# Patient Record
Sex: Female | Born: 1955 | Race: White | Hispanic: No | Marital: Married | Smoking: Never smoker
Health system: Southern US, Community
[De-identification: ages and names within clinical notes are randomized; demographics above are authoritative.]

## PROBLEM LIST (undated history)

## (undated) DIAGNOSIS — E039 Hypothyroidism, unspecified: Secondary | ICD-10-CM

## (undated) DIAGNOSIS — J302 Other seasonal allergic rhinitis: Secondary | ICD-10-CM

## (undated) DIAGNOSIS — M1990 Unspecified osteoarthritis, unspecified site: Secondary | ICD-10-CM

## (undated) DIAGNOSIS — I10 Essential (primary) hypertension: Secondary | ICD-10-CM

## (undated) DIAGNOSIS — K219 Gastro-esophageal reflux disease without esophagitis: Secondary | ICD-10-CM

## (undated) DIAGNOSIS — Z923 Personal history of irradiation: Secondary | ICD-10-CM

## (undated) DIAGNOSIS — Z9889 Other specified postprocedural states: Secondary | ICD-10-CM

## (undated) DIAGNOSIS — R112 Nausea with vomiting, unspecified: Secondary | ICD-10-CM

## (undated) DIAGNOSIS — C50919 Malignant neoplasm of unspecified site of unspecified female breast: Secondary | ICD-10-CM

## (undated) HISTORY — PX: BREAST BIOPSY: SHX20

## (undated) HISTORY — PX: BREAST LUMPECTOMY: SHX2

## (undated) HISTORY — PX: BREAST EXCISIONAL BIOPSY: SUR124

## (undated) HISTORY — PX: DILATION AND CURETTAGE OF UTERUS: SHX78

## (undated) HISTORY — PX: APPENDECTOMY: SHX54

## (undated) HISTORY — PX: KNEE ARTHROSCOPY: SUR90

## (undated) HISTORY — PX: BREAST SURGERY: SHX581

---

## 2014-02-19 DIAGNOSIS — Z923 Personal history of irradiation: Secondary | ICD-10-CM

## 2014-02-19 DIAGNOSIS — C50919 Malignant neoplasm of unspecified site of unspecified female breast: Secondary | ICD-10-CM

## 2014-02-19 HISTORY — DX: Personal history of irradiation: Z92.3

## 2014-02-19 HISTORY — DX: Malignant neoplasm of unspecified site of unspecified female breast: C50.919

## 2014-10-05 DIAGNOSIS — D0511 Intraductal carcinoma in situ of right breast: Secondary | ICD-10-CM | POA: Insufficient documentation

## 2014-10-22 ENCOUNTER — Ambulatory Visit: Payer: Self-pay | Admitting: Surgery

## 2014-10-22 NOTE — H&P (Signed)
History of Present Illness Amanda Haas. Amanda Pottinger MD; 10/22/2014 5:07 PM) Patient words: right breast eval.  The patient is a 59 year old female who presents with a breast mass. Referred by Amanda Aly, NP for evaluation of right breast mass  This is a 59 yo female in good health who presents for evaluation of a new right breast mass. 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 She had a recent routine screening mammogram performed at [facility] and this showed a suspicious area in the right breast in the lower central portion. She underwent stereotactic biopsy which showed calcifications associated with atypical papillary lesion. A biopsy clip was placed. She presents now to discuss excision.  No previous breast problems before mammogram. Significant hematoma and bruising after biopsy.  Menarche -21 First pregnancy 25 Breastfeed - no OCP - 6 months FH - negative Other Problems Amanda Haas, CMA; 10/22/2014 10:08 AM) Arthritis Asthma Diabetes Mellitus Gastric Ulcer Gastroesophageal Reflux Disease High blood pressure Hypercholesterolemia Lump In Breast Thyroid Disease  Past Surgical History Amanda Haas, [place]; 10/22/2014 10:08 AM) Appendectomy Breast Biopsy Right. Knee Surgery Left.  Diagnostic Studies History Amanda Haas, CMA; 10/22/2014 10:08 AM) Colonoscopy 1-5 years ago Mammogram within last year Pap Smear 1-5 years ago  Allergies Amanda Haas, CMA; 10/22/2014 10:09 AM) Lodine *ANALGESICS - ANTI-INFLAMMATORY*  Medication History (Amanda Haas, CMA; 10/22/2014 10:10 AM) Atorvastatin Calcium (10MG  Tablet, Oral) Active. Hydrochlorothiazide (25MG  Tablet, Oral) Active. Pantoprazole Sodium (40MG  Tablet DR, Oral) Active. Levothyroxine Sodium (100MCG Tablet, Oral) Active. Medications Reconciled  Social History Amanda Haas, CMA; 10/22/2014 10:08 AM) Caffeine use Coffee, Tea. No alcohol use No drug use Tobacco use Never  smoker.  Family History Amanda Haas, CMA; 10/22/2014 10:08 AM) Arthritis Father, Mother. Colon Cancer Father. Colon Polyps Father. Diabetes Mellitus Father. Heart Disease Father. Hypertension Father, Mother. Malignant Neoplasm Of Pancreas Mother. Thyroid problems Mother.  Pregnancy / Birth History Amanda Haas, [place]; 10/22/2014 10:08 AM) Age at menarche 70 years. Age of menopause 43-50 Gravida 2 Maternal age 65-25 Para 2     Review of Systems (Amanda Haas; 10/22/2014 10:08 AM) General Not Present- Appetite Loss, Chills, Fatigue, Fever, Night Sweats, Weight Gain and Weight Loss. Skin Not Present- Change in Wart/Mole, Dryness, Hives, Jaundice, New Lesions, Non-Healing Wounds, Rash and Ulcer. HEENT Present- Wears glasses/contact lenses. Not Present- Earache, Hearing Loss, Hoarseness, Nose Bleed, Oral Ulcers, Ringing in the Ears, Seasonal Allergies, Sinus Pain, Sore Throat, Visual Disturbances and Yellow Eyes. Respiratory Not Present- Bloody sputum, Chronic Cough, Difficulty Breathing, Snoring and Wheezing. Breast Present- Breast Mass. Not Present- Breast Pain, Nipple Discharge and Skin Changes. Cardiovascular Not Present- Chest Pain, Difficulty Breathing Lying Down, Leg Cramps, Palpitations, Rapid Heart Rate, Shortness of Breath and Swelling of Extremities. Gastrointestinal Not Present- Abdominal Pain, Bloating, Bloody Stool, Change in Bowel Habits, Chronic diarrhea, Constipation, Difficulty Swallowing, Excessive gas, Gets full quickly at meals, Hemorrhoids, Indigestion, Nausea, Rectal Pain and Vomiting. Female Genitourinary Not Present- Frequency, Nocturia, Painful Urination, Pelvic Pain and Urgency. Musculoskeletal Present- Back Pain and Joint Pain. Not Present- Joint Stiffness, Muscle Pain, Muscle Weakness and Swelling of Extremities. Neurological Not Present- Decreased Memory, Fainting, Headaches, Numbness, Seizures, Tingling, Tremor, Trouble walking and  Weakness. Psychiatric Not Present- Anxiety, Bipolar, Change in Sleep Pattern, Depression, Fearful and Frequent crying. Endocrine Not Present- Cold Intolerance, Excessive Hunger, Hair Changes, Heat Intolerance, Hot flashes and New Diabetes. Hematology Not Present- Easy Bruising, Excessive bleeding, Gland problems, HIV and Persistent Infections.  Vitals Amanda Haas CMA; 10/22/2014 10:09  AM) 10/22/2014 10:08 AM Weight: 178 lb Height: 67in Body Surface Area: 1.95 m Body Mass Index: 27.88 kg/m Temp.: 57F(Temporal)  BP: 130/76 (Sitting, Left Arm, Standard)     Physical Exam Amanda Key K. Cadyn Fann MD; 10/22/2014 5:07 PM)  The physical exam findings are as follows: Note:WDWN in NAD HEENT: EOMI, sclera anicteric Neck: No masses, no thyromegaly Breasts: no palpable masses or lymphdenopathy on the left; no right lymphadenopathy; small resolving hematoma on the right inferior breast Lungs: CTA bilaterally; normal respiratory effort CV: Regular rate and rhythm; no murmurs Abd: +bowel sounds, soft, non-tender, no masses Ext: Well-perfused; no edema Skin: Warm, dry; no sign of jaundice    Assessment & Plan Amanda Key K. Kaven Cumbie MD; 10/22/2014 10:43 AM)  BREAST MASS, RIGHT (611.72  N63)  Current Plans Pt Education - Breast Cancer in Women *: breast lumps Schedule for Surgery - Right radioactive seed-localized lumpectomy. The surgical procedure has been discussed with the patient. Potential risks, benefits, alternative treatments, and expected outcomes have been explained. All of the patient's questions at this time have been answered. The likelihood of reaching the patient's treatment goal is good. The patient understand the proposed surgical procedure and wishes to proceed.  Amanda Haas. Amanda Dover, MD, [facility]  General/ Trauma Surgery  10/22/2014 5:09 PM

## 2014-11-15 ENCOUNTER — Encounter (HOSPITAL_BASED_OUTPATIENT_CLINIC_OR_DEPARTMENT_OTHER): Payer: Self-pay | Admitting: *Deleted

## 2014-11-17 ENCOUNTER — Ambulatory Visit (HOSPITAL_BASED_OUTPATIENT_CLINIC_OR_DEPARTMENT_OTHER): Payer: BLUE CROSS/BLUE SHIELD | Admitting: Anesthesiology

## 2014-11-17 ENCOUNTER — Ambulatory Visit (HOSPITAL_BASED_OUTPATIENT_CLINIC_OR_DEPARTMENT_OTHER)
Admission: RE | Admit: 2014-11-17 | Discharge: 2014-11-17 | Disposition: A | Discharge status: Home / Self Care | Payer: BLUE CROSS/BLUE SHIELD | Source: Ambulatory Visit | Attending: Surgery | Admitting: Surgery

## 2014-11-17 ENCOUNTER — Encounter (HOSPITAL_BASED_OUTPATIENT_CLINIC_OR_DEPARTMENT_OTHER)
Admission: RE | Disposition: A | Discharge status: Home / Self Care | Payer: Self-pay | Source: Ambulatory Visit | Attending: Surgery

## 2014-11-17 ENCOUNTER — Encounter (HOSPITAL_BASED_OUTPATIENT_CLINIC_OR_DEPARTMENT_OTHER): Payer: Self-pay | Admitting: Anesthesiology

## 2014-11-17 DIAGNOSIS — E039 Hypothyroidism, unspecified: Secondary | ICD-10-CM | POA: Insufficient documentation

## 2014-11-17 DIAGNOSIS — D0511 Intraductal carcinoma in situ of right breast: Secondary | ICD-10-CM | POA: Diagnosis not present

## 2014-11-17 DIAGNOSIS — I10 Essential (primary) hypertension: Secondary | ICD-10-CM | POA: Insufficient documentation

## 2014-11-17 DIAGNOSIS — E119 Type 2 diabetes mellitus without complications: Secondary | ICD-10-CM | POA: Diagnosis not present

## 2014-11-17 DIAGNOSIS — D4861 Neoplasm of uncertain behavior of right breast: Secondary | ICD-10-CM | POA: Diagnosis present

## 2014-11-17 HISTORY — DX: Nausea with vomiting, unspecified: R11.2

## 2014-11-17 HISTORY — DX: Unspecified osteoarthritis, unspecified site: M19.90

## 2014-11-17 HISTORY — DX: Essential (primary) hypertension: I10

## 2014-11-17 HISTORY — PX: BREAST LUMPECTOMY WITH RADIOACTIVE SEED LOCALIZATION: SHX6424

## 2014-11-17 HISTORY — DX: Hypothyroidism, unspecified: E03.9

## 2014-11-17 HISTORY — DX: Other seasonal allergic rhinitis: J30.2

## 2014-11-17 HISTORY — DX: Other specified postprocedural states: Z98.890

## 2014-11-17 HISTORY — DX: Gastro-esophageal reflux disease without esophagitis: K21.9

## 2014-11-17 SURGERY — BREAST LUMPECTOMY WITH RADIOACTIVE SEED LOCALIZATION
Anesthesia: General | Site: Breast | Laterality: Right

## 2014-11-17 MED ORDER — BUPIVACAINE-EPINEPHRINE 0.25% -1:200000 IJ SOLN
INTRAMUSCULAR | Status: DC | PRN
  Administered 2014-11-17: 10 mL

## 2014-11-17 MED ORDER — HYDROCODONE-ACETAMINOPHEN 5-325 MG PO TABS: 1.0000 | ORAL_TABLET | ORAL | Status: DC | PRN

## 2014-11-17 MED ORDER — MORPHINE SULFATE (PF) 2 MG/ML IV SOLN: 2.0000 mg | INTRAVENOUS | Status: DC | PRN

## 2014-11-17 MED ORDER — CEFAZOLIN SODIUM-DEXTROSE 2-3 GM-% IV SOLR
2.0000 g | INTRAVENOUS | Status: AC
  Administered 2014-11-17: 2 g via INTRAVENOUS

## 2014-11-17 MED ORDER — GLYCOPYRROLATE 0.2 MG/ML IJ SOLN
INTRAMUSCULAR | Status: AC
  Filled 2014-11-17: qty 2

## 2014-11-17 MED ORDER — EPHEDRINE SULFATE 50 MG/ML IJ SOLN
INTRAMUSCULAR | Status: AC
  Filled 2014-11-17: qty 1

## 2014-11-17 MED ORDER — BUPIVACAINE-EPINEPHRINE (PF) 0.25% -1:200000 IJ SOLN
INTRAMUSCULAR | Status: AC
Start: 2014-11-17 — End: 2014-11-17
  Filled 2014-11-17: qty 30

## 2014-11-17 MED ORDER — FENTANYL CITRATE (PF) 100 MCG/2ML IJ SOLN
INTRAMUSCULAR | Status: AC
  Filled 2014-11-17: qty 4

## 2014-11-17 MED ORDER — PHENYLEPHRINE HCL 10 MG/ML IJ SOLN
INTRAMUSCULAR | Status: AC
  Filled 2014-11-17: qty 1

## 2014-11-17 MED ORDER — PROPOFOL 10 MG/ML IV BOLUS
INTRAVENOUS | Status: DC | PRN
  Administered 2014-11-17: 200 mg via INTRAVENOUS

## 2014-11-17 MED ORDER — MIDAZOLAM HCL 2 MG/2ML IJ SOLN
1.0000 mg | INTRAMUSCULAR | Status: DC | PRN
Start: 2014-11-17 — End: 2014-11-17

## 2014-11-17 MED ORDER — ATROPINE SULFATE 0.4 MG/ML IJ SOLN
INTRAMUSCULAR | Status: AC
  Filled 2014-11-17: qty 1

## 2014-11-17 MED ORDER — FENTANYL CITRATE (PF) 100 MCG/2ML IJ SOLN
INTRAMUSCULAR | Status: DC | PRN
  Administered 2014-11-17: 100 ug via INTRAVENOUS

## 2014-11-17 MED ORDER — PROPOFOL 10 MG/ML IV BOLUS
INTRAVENOUS | Status: AC
  Filled 2014-11-17: qty 20

## 2014-11-17 MED ORDER — GLYCOPYRROLATE 0.2 MG/ML IJ SOLN: 0.2000 mg | Freq: Once | INTRAMUSCULAR | Status: DC | PRN

## 2014-11-17 MED ORDER — MIDAZOLAM HCL 5 MG/5ML IJ SOLN
INTRAMUSCULAR | Status: DC | PRN
  Administered 2014-11-17: 2 mg via INTRAVENOUS

## 2014-11-17 MED ORDER — MIDAZOLAM HCL 2 MG/2ML IJ SOLN
INTRAMUSCULAR | Status: AC
Start: 2014-11-17 — End: 2014-11-17
  Filled 2014-11-17: qty 4

## 2014-11-17 MED ORDER — ONDANSETRON HCL 4 MG/2ML IJ SOLN
INTRAMUSCULAR | Status: AC
  Filled 2014-11-17: qty 2

## 2014-11-17 MED ORDER — LACTATED RINGERS IV SOLN
INTRAVENOUS | Status: DC
  Administered 2014-11-17 (×2): via INTRAVENOUS

## 2014-11-17 MED ORDER — CHLORHEXIDINE GLUCONATE 4 % EX LIQD: 1.0000 "application " | Freq: Once | CUTANEOUS | Status: DC

## 2014-11-17 MED ORDER — FENTANYL CITRATE (PF) 100 MCG/2ML IJ SOLN: 50.0000 ug | INTRAMUSCULAR | Status: DC | PRN

## 2014-11-17 MED ORDER — LIDOCAINE HCL (CARDIAC) 20 MG/ML IV SOLN
INTRAVENOUS | Status: AC
  Filled 2014-11-17: qty 5

## 2014-11-17 MED ORDER — ONDANSETRON HCL 4 MG/2ML IJ SOLN
INTRAMUSCULAR | Status: DC | PRN
  Administered 2014-11-17: 4 mg via INTRAVENOUS

## 2014-11-17 MED ORDER — 0.9 % SODIUM CHLORIDE (POUR BTL) OPTIME
TOPICAL | Status: DC | PRN
  Administered 2014-11-17: 250 mL

## 2014-11-17 MED ORDER — ONDANSETRON HCL 4 MG/2ML IJ SOLN: 4.0000 mg | INTRAMUSCULAR | Status: DC | PRN

## 2014-11-17 MED ORDER — CEFAZOLIN SODIUM-DEXTROSE 2-3 GM-% IV SOLR
INTRAVENOUS | Status: AC
  Filled 2014-11-17: qty 50

## 2014-11-17 MED ORDER — SUCCINYLCHOLINE CHLORIDE 20 MG/ML IJ SOLN
INTRAMUSCULAR | Status: AC
  Filled 2014-11-17: qty 1

## 2014-11-17 MED ORDER — LIDOCAINE HCL (CARDIAC) 20 MG/ML IV SOLN
INTRAVENOUS | Status: DC | PRN
  Administered 2014-11-17: 50 mg via INTRAVENOUS

## 2014-11-17 MED ORDER — DEXAMETHASONE SODIUM PHOSPHATE 4 MG/ML IJ SOLN
INTRAMUSCULAR | Status: DC | PRN
  Administered 2014-11-17: 10 mg via INTRAVENOUS

## 2014-11-17 MED ORDER — DEXAMETHASONE SODIUM PHOSPHATE 10 MG/ML IJ SOLN
INTRAMUSCULAR | Status: AC
  Filled 2014-11-17: qty 1

## 2014-11-17 SURGICAL SUPPLY — 47 items
APPLIER CLIP 9.375 MED OPEN (MISCELLANEOUS)
BENZOIN TINCTURE PRP APPL 2/3 (GAUZE/BANDAGES/DRESSINGS) ×2 IMPLANT
BLADE HEX COATED 2.75 (ELECTRODE) ×2 IMPLANT
BLADE SURG 15 STRL LF DISP TIS (BLADE) ×1 IMPLANT
BLADE SURG 15 STRL SS (BLADE) ×1
CANISTER SUC SOCK COL 7IN (MISCELLANEOUS) IMPLANT
CANISTER SUCT 1200ML W/VALVE (MISCELLANEOUS) ×2 IMPLANT
CHLORAPREP W/TINT 26ML (MISCELLANEOUS) ×2 IMPLANT
CLIP APPLIE 9.375 MED OPEN (MISCELLANEOUS) IMPLANT
COVER BACK TABLE 60X90IN (DRAPES) ×2 IMPLANT
COVER MAYO STAND STRL (DRAPES) ×2 IMPLANT
COVER PROBE W GEL 5X96 (DRAPES) ×2 IMPLANT
DECANTER SPIKE VIAL GLASS SM (MISCELLANEOUS) ×2 IMPLANT
DEVICE DUBIN W/COMP PLATE 8390 (MISCELLANEOUS) ×4 IMPLANT
DRAPE LAPAROTOMY 100X72 PEDS (DRAPES) ×2 IMPLANT
DRAPE UTILITY XL STRL (DRAPES) ×2 IMPLANT
DRSG TEGADERM 4X4.75 (GAUZE/BANDAGES/DRESSINGS) ×2 IMPLANT
ELECT REM PT RETURN 9FT ADLT (ELECTROSURGICAL) ×2
ELECTRODE REM PT RTRN 9FT ADLT (ELECTROSURGICAL) ×1 IMPLANT
GLOVE BIO SURGEON STRL SZ7 (GLOVE) ×2 IMPLANT
GLOVE BIOGEL PI IND STRL 7.5 (GLOVE) ×1 IMPLANT
GLOVE BIOGEL PI INDICATOR 7.5 (GLOVE) ×1
GLOVE EXAM NITRILE LRG STRL (GLOVE) ×2 IMPLANT
GLOVE SURG SS PI 7.0 STRL IVOR (GLOVE) ×2 IMPLANT
GOWN STRL REUS W/ TWL LRG LVL3 (GOWN DISPOSABLE) ×2 IMPLANT
GOWN STRL REUS W/TWL LRG LVL3 (GOWN DISPOSABLE) ×2
KIT MARKER MARGIN INK (KITS) ×2 IMPLANT
NEEDLE HYPO 25X1 1.5 SAFETY (NEEDLE) ×2 IMPLANT
NS IRRIG 1000ML POUR BTL (IV SOLUTION) ×2 IMPLANT
PACK BASIN DAY SURGERY FS (CUSTOM PROCEDURE TRAY) ×2 IMPLANT
PENCIL BUTTON HOLSTER BLD 10FT (ELECTRODE) ×2 IMPLANT
SLEEVE SCD COMPRESS KNEE MED (MISCELLANEOUS) ×2 IMPLANT
SPONGE GAUZE 2X2 8PLY STRL LF (GAUZE/BANDAGES/DRESSINGS) IMPLANT
SPONGE GAUZE 4X4 12PLY STER LF (GAUZE/BANDAGES/DRESSINGS) ×2 IMPLANT
SPONGE LAP 18X18 X RAY DECT (DISPOSABLE) IMPLANT
SPONGE LAP 4X18 X RAY DECT (DISPOSABLE) ×4 IMPLANT
STRIP CLOSURE SKIN 1/2X4 (GAUZE/BANDAGES/DRESSINGS) ×2 IMPLANT
SUT MON AB 4-0 PC3 18 (SUTURE) ×2 IMPLANT
SUT SILK 2 0 SH (SUTURE) IMPLANT
SUT VIC AB 3-0 SH 27 (SUTURE) ×1
SUT VIC AB 3-0 SH 27X BRD (SUTURE) ×1 IMPLANT
SYR BULB 3OZ (MISCELLANEOUS) IMPLANT
SYR CONTROL 10ML LL (SYRINGE) ×2 IMPLANT
TOWEL OR 17X24 6PK STRL BLUE (TOWEL DISPOSABLE) ×2 IMPLANT
TOWEL OR NON WOVEN STRL DISP B (DISPOSABLE) ×2 IMPLANT
TUBE CONNECTING 20X1/4 (TUBING) ×2 IMPLANT
YANKAUER SUCT BULB TIP NO VENT (SUCTIONS) ×2 IMPLANT

## 2014-11-17 NOTE — Interval H&P Note (Signed)
History and Physical Interval Note:  11/17/2014 8:11 AM  Amanda Haas  has presented today for surgery, with the diagnosis of Atypical Papilloma Right Breast  The various methods of treatment have been discussed with the patient and family. After consideration of risks, benefits and other options for treatment, the patient has consented to  Procedure(s): RIGHT RADIOACTIVE SEED LUMPECTOMY (Right) as a surgical intervention .  The patient's history has been reviewed, patient examined, no change in status, stable for surgery.  I have reviewed the patient's chart and labs.  Questions were answered to the patient's satisfaction.     TSUEI,MATTHEW K.

## 2014-11-17 NOTE — Op Note (Signed)
Preop diagnosis:  Atypical papillary lesion with microcalcifications right breast Postop diagnosis: Same Procedure performed: Right radioactive seed localized lumpectomy Surgeon:TSUEI,MATTHEW K. Anesthesia: Gen. Via LMA Indications: This is a 59 year old female who presents with an area of microcalcifications in the right lower central breast. Biopsy showed calcifications associated with an atypical papillary lesion. A biopsy clip was placed at that time. She presents now for radioactive seed localized lumpectomy. The seed was placed yesterday.  Description of procedure: The patient is brought to the operating room and placed in a supine position on the operating room table. Presence of the seed had been confirmed using the neoprobe in the holding area. After an adequate level of general anesthesia was obtained, her right breast was prepped with ChloraPrep and draped sterile fashion. A timeout was taken to ensure the proper patient and proper procedure. We identified the area of greatest activity with the neoprobe. We have treated this area with 0.25% Marcaine with epinephrine. A transverse incision was made. Dissection was carried down in the breast tissue with cautery. We raised 4 margins around the area of activity. We carried this margins all the way down to the chest wall. The specimen was then dissected off the chest wall. The specimen was oriented with a paint kit. Specimen mammogram confirmed the presence of the radioactive seed. However the biopsy clip was not identified. I conferred with the radiologist and appears that the clip should be superior and lateral to the seed. We excised the superior margin twice and repeat specimen mammograms did not show the presence of the biopsy clip. On the third specimen I thought that I noticed a hard metallic clip on the surface but this did not show up on the specimen mammogram. I did not palpate any biopsy clip within the breast itself. The microcalcifications  are included in the specimen. We made the decision to stop excising additional breast tissue. We will obtain a follow-up mammogram later.  The wound was irrigated and inspected for hemostasis. The wound was closed with 3-0 Vicryl and 4-0 Monocryl. Steri-Strips and clean dressings were applied. The patient was then next patent but recovery was stable condition. All sponge, initially, and needle counts are correct.  [name]. Georgette Dover, MD, [facility]  General/ Trauma Surgery  11/17/2014 10:53 AM

## 2014-11-17 NOTE — Anesthesia Postprocedure Evaluation (Signed)
  Anesthesia Post-op Note  Patient: Amanda Haas  Procedure(s) Performed: Procedure(s): RIGHT RADIOACTIVE SEED LUMPECTOMY (Right)  Patient Location: PACU  Anesthesia Type:General  Level of Consciousness: awake  Airway and Oxygen Therapy: Patient Spontanous Breathing  Post-op Pain: mild  Post-op Assessment: Post-op Vital signs reviewed              Post-op Vital Signs: Reviewed  Last Vitals:  Filed Vitals:   11/17/14 1156  BP: 136/83  Pulse: 74  Temp: 36.7 C  Resp: 16    Complications: No apparent anesthesia complications

## 2014-11-17 NOTE — Anesthesia Preprocedure Evaluation (Addendum)
Anesthesia Evaluation  Patient identified by MRN, date of birth, ID band Patient awake    Reviewed: Allergy & Precautions, NPO status   History of Anesthesia Complications (+) PONV  Airway Mallampati: II  TM Distance: >3 FB Neck ROM: Full    Dental   Pulmonary neg pulmonary ROS,    breath sounds clear to auscultation       Cardiovascular hypertension,  Rhythm:Regular Rate:Normal     Neuro/Psych    GI/Hepatic Neg liver ROS, GERD  ,  Endo/Other  Hypothyroidism   Renal/GU negative Renal ROS     Musculoskeletal   Abdominal   Peds  Hematology   Anesthesia Other Findings   Reproductive/Obstetrics                            Anesthesia Physical Anesthesia Plan  ASA: II  Anesthesia Plan: General   Post-op Pain Management:    Induction: Intravenous  Airway Management Planned: Oral ETT  Additional Equipment:   Intra-op Plan:   Post-operative Plan: Extubation in OR  Informed Consent: I have reviewed the patients History and Physical, chart, labs and discussed the procedure including the risks, benefits and alternatives for the proposed anesthesia with the patient or authorized representative who has indicated his/her understanding and acceptance.   Dental advisory given  Plan Discussed with: CRNA and Anesthesiologist  Anesthesia Plan Comments:         Anesthesia Quick Evaluation

## 2014-11-17 NOTE — Discharge Instructions (Signed)
[facility] °Office Phone Number [phone] ° °BREAST BIOPSY/ PARTIAL MASTECTOMY: POST OP INSTRUCTIONS ° °Always review your discharge instruction sheet given to you by the facility where your surgery was performed. ° °IF YOU HAVE DISABILITY OR FAMILY LEAVE FORMS, YOU MUST BRING THEM TO THE OFFICE FOR PROCESSING.  DO NOT GIVE THEM TO YOUR DOCTOR. ° °1. A prescription for pain medication may be given to you upon discharge.  Take your pain medication as prescribed, if needed.  If narcotic pain medicine is not needed, then you may take acetaminophen (Tylenol) or ibuprofen (Advil) as needed. °2. Take your usually prescribed medications unless otherwise directed °3. If you need a refill on your pain medication, please contact your pharmacy.  They will contact our office to request authorization.  Prescriptions will not be filled after 5pm or on week-ends. °4. You should eat very light the first 24 hours after surgery, such as soup, crackers, pudding, etc.  Resume your normal diet the day after surgery. °5. Most patients will experience some swelling and bruising in the breast.  Ice packs and a good support bra will help.  Swelling and bruising can take several days to resolve.  °6. It is common to experience some constipation if taking pain medication after surgery.  Increasing fluid intake and taking a stool softener will usually help or prevent this problem from occurring.  A mild laxative (Milk of Magnesia or Miralax) should be taken according to package directions if there are no bowel movements after 48 hours. °7. Unless discharge instructions indicate otherwise, you may remove your bandages 48 hours after surgery, and you may shower at that time.  You will have steri-strips (small skin tapes) in place directly over the incision.  These strips should be left on the skin for 7-10 days.   Any sutures or staples will be removed at the office during your follow-up visit. °8. ACTIVITIES:  You may resume  regular daily activities (gradually increasing) beginning the next day.  Wearing a good support bra or sports bra minimizes pain and swelling.  You may have sexual intercourse when it is comfortable. °a. You may drive when you no longer are taking prescription pain medication, you can comfortably wear a seatbelt, and you can safely maneuver your car and apply brakes. °b. RETURN TO WORK:  1-2 weeks °9. You should see your doctor in the office for a follow-up appointment approximately two weeks after your surgery.  Your doctor’s nurse will typically make your follow-up appointment when she calls you with your pathology report.  Expect your pathology report 2-3 business days after your surgery.  You may call to check if you do not hear from us after three days. °10. OTHER INSTRUCTIONS: _______________________________________________________________________________________________ _____________________________________________________________________________________________________________________________________ °_____________________________________________________________________________________________________________________________________ °_____________________________________________________________________________________________________________________________________ ° °WHEN TO CALL YOUR DOCTOR: °1. Fever over 101.0 °2. Nausea and/or vomiting. °3. Extreme swelling or bruising. °4. Continued bleeding from incision. °5. Increased pain, redness, or drainage from the incision. ° °The clinic staff is available to answer your questions during regular business hours.  Please don’t hesitate to call and ask to speak to one of the nurses for clinical concerns.  If you have a medical emergency, go to the nearest emergency room or call 911.  A surgeon from [facility] is always on call at the hospital. ° °For further questions, please visit [email or web address]  ° ° °Post Anesthesia Home Care  Instructions ° °Activity: °Get plenty of rest for the remainder of the day. A responsible adult should stay with   you for 24 hours following the procedure.  For the next 24 hours, DO NOT: -Drive a car -Paediatric nurse -Drink alcoholic beverages -Take any medication unless instructed by your physician -Make any legal decisions or sign important papers.  Meals: Start with liquid foods such as gelatin or soup. Progress to regular foods as tolerated. Avoid greasy, spicy, heavy foods. If nausea and/or vomiting occur, drink only clear liquids until the nausea and/or vomiting subsides. Call your physician if vomiting continues.  Special Instructions/Symptoms: Your throat may feel dry or sore from the anesthesia or the breathing tube placed in your throat during surgery. If this causes discomfort, gargle with warm salt water. The discomfort should disappear within 24 hours.  If you had a scopolamine patch placed behind your ear for the management of post- operative nausea and/or vomiting:  1. The medication in the patch is effective for 72 hours, after which it should be removed.  Wrap patch in a tissue and discard in the trash. Wash hands thoroughly with soap and water. 2. You may remove the patch earlier than 72 hours if you experience unpleasant side effects which may include dry mouth, dizziness or visual disturbances. 3. Avoid touching the patch. Wash your hands with soap and water after contact with the patch.   Call your surgeon if you experience:   1.  Fever over 101.0. 2.  Inability to urinate. 3.  Nausea and/or vomiting. 4.  Extreme swelling or bruising at the surgical site. 5.  Continued bleeding from the incision. 6.  Increased pain, redness or drainage from the incision. 7.  Problems related to your pain medication. 8. Any change in color, movement and/or sensation 9. Any problems and/or concerns

## 2014-11-17 NOTE — Anesthesia Procedure Notes (Signed)
Procedure Name: LMA Insertion Date/Time: 11/17/2014 9:50 AM Performed by: Toula Moos L Pre-anesthesia Checklist: Patient identified, Emergency Drugs available, Suction available, Patient being monitored and Timeout performed Patient Re-evaluated:Patient Re-evaluated prior to inductionOxygen Delivery Method: Circle System Utilized Preoxygenation: Pre-oxygenation with 100% oxygen Intubation Type: IV induction Ventilation: Mask ventilation without difficulty LMA: LMA inserted LMA Size: 4.0 Number of attempts: 1 Airway Equipment and Method: Bite block Placement Confirmation: positive ETCO2 Tube secured with: Tape Dental Injury: Teeth and Oropharynx as per pre-operative assessment

## 2014-11-17 NOTE — Transfer of Care (Signed)
Immediate Anesthesia Transfer of Care Note  Patient: Amanda Haas  Procedure(s) Performed: Procedure(s): RIGHT RADIOACTIVE SEED LUMPECTOMY (Right)  Patient Location: PACU  Anesthesia Type:General  Level of Consciousness: awake and patient cooperative  Airway & Oxygen Therapy: Patient Spontanous Breathing and Patient connected to face mask oxygen  Post-op Assessment: Report given to RN and Post -op Vital signs reviewed and stable  Post vital signs: Reviewed and stable  Last Vitals:  Filed Vitals:   11/17/14 0806  BP: 134/81  Pulse: 84  Temp: 36.4 C  Resp: 18    Complications: No apparent anesthesia complications

## 2014-11-17 NOTE — H&P (View-Only) (Signed)
History of Present Illness Amanda Haas. Amanda Schaffer MD; 10/22/2014 5:07 PM) Patient words: right breast eval.  The patient is a 59 year old female who presents with a breast mass. Referred by Amanda Aly, NP for evaluation of right breast mass  This is a 59 yo female in good health who presents for evaluation of a new right breast mass. 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 She had a recent routine screening mammogram performed at [facility] and this showed a suspicious area in the right breast in the lower central portion. She underwent stereotactic biopsy which showed calcifications associated with atypical papillary lesion. A biopsy clip was placed. She presents now to discuss excision.  No previous breast problems before mammogram. Significant hematoma and bruising after biopsy.  Menarche -29 First pregnancy 25 Breastfeed - no OCP - 6 months FH - negative Other Problems Amanda Haas, CMA; 10/22/2014 10:08 AM) Arthritis Asthma Diabetes Mellitus Gastric Ulcer Gastroesophageal Reflux Disease High blood pressure Hypercholesterolemia Lump In Breast Thyroid Disease  Past Surgical History Amanda Haas, [place]; 10/22/2014 10:08 AM) Appendectomy Breast Biopsy Right. Knee Surgery Left.  Diagnostic Studies History Amanda Haas, CMA; 10/22/2014 10:08 AM) Colonoscopy 1-5 years ago Mammogram within last year Pap Smear 1-5 years ago  Allergies Amanda Haas, CMA; 10/22/2014 10:09 AM) Lodine *ANALGESICS - ANTI-INFLAMMATORY*  Medication History (Amanda Haas, CMA; 10/22/2014 10:10 AM) Atorvastatin Calcium (10MG  Tablet, Oral) Active. Hydrochlorothiazide (25MG  Tablet, Oral) Active. Pantoprazole Sodium (40MG  Tablet DR, Oral) Active. Levothyroxine Sodium (100MCG Tablet, Oral) Active. Medications Reconciled  Social History Amanda Haas, CMA; 10/22/2014 10:08 AM) Caffeine use Coffee, Tea. No alcohol use No drug use Tobacco use Never  smoker.  Family History Amanda Haas, CMA; 10/22/2014 10:08 AM) Arthritis Father, Mother. Colon Cancer Father. Colon Polyps Father. Diabetes Mellitus Father. Heart Disease Father. Hypertension Father, Mother. Malignant Neoplasm Of Pancreas Mother. Thyroid problems Mother.  Pregnancy / Birth History Amanda Haas, [place]; 10/22/2014 10:08 AM) Age at menarche 20 years. Age of menopause 13-50 Gravida 2 Maternal age 47-25 Para 2     Review of Systems (Amanda Haas; 10/22/2014 10:08 AM) General Not Present- Appetite Loss, Chills, Fatigue, Fever, Night Sweats, Weight Gain and Weight Loss. Skin Not Present- Change in Wart/Mole, Dryness, Hives, Jaundice, New Lesions, Non-Healing Wounds, Rash and Ulcer. HEENT Present- Wears glasses/contact lenses. Not Present- Earache, Hearing Loss, Hoarseness, Nose Bleed, Oral Ulcers, Ringing in the Ears, Seasonal Allergies, Sinus Pain, Sore Throat, Visual Disturbances and Yellow Eyes. Respiratory Not Present- Bloody sputum, Chronic Cough, Difficulty Breathing, Snoring and Wheezing. Breast Present- Breast Mass. Not Present- Breast Pain, Nipple Discharge and Skin Changes. Cardiovascular Not Present- Chest Pain, Difficulty Breathing Lying Down, Leg Cramps, Palpitations, Rapid Heart Rate, Shortness of Breath and Swelling of Extremities. Gastrointestinal Not Present- Abdominal Pain, Bloating, Bloody Stool, Change in Bowel Habits, Chronic diarrhea, Constipation, Difficulty Swallowing, Excessive gas, Gets full quickly at meals, Hemorrhoids, Indigestion, Nausea, Rectal Pain and Vomiting. Female Genitourinary Not Present- Frequency, Nocturia, Painful Urination, Pelvic Pain and Urgency. Musculoskeletal Present- Back Pain and Joint Pain. Not Present- Joint Stiffness, Muscle Pain, Muscle Weakness and Swelling of Extremities. Neurological Not Present- Decreased Memory, Fainting, Headaches, Numbness, Seizures, Tingling, Tremor, Trouble walking and  Weakness. Psychiatric Not Present- Anxiety, Bipolar, Change in Sleep Pattern, Depression, Fearful and Frequent crying. Endocrine Not Present- Cold Intolerance, Excessive Hunger, Hair Changes, Heat Intolerance, Hot flashes and New Diabetes. Hematology Not Present- Easy Bruising, Excessive bleeding, Gland problems, HIV and Persistent Infections.  Vitals Amanda Haas CMA; 10/22/2014 10:09  AM) 10/22/2014 10:08 AM Weight: 178 lb Height: 67in Body Surface Area: 1.95 m Body Mass Index: 27.88 kg/m Temp.: 59F(Temporal)  BP: 130/76 (Sitting, Left Arm, Standard)     Physical Exam Amanda Key K. Chelsei Mcchesney MD; 10/22/2014 5:07 PM)  The physical exam findings are as follows: Note:WDWN in NAD HEENT: EOMI, sclera anicteric Neck: No masses, no thyromegaly Breasts: no palpable masses or lymphdenopathy on the left; no right lymphadenopathy; small resolving hematoma on the right inferior breast Lungs: CTA bilaterally; normal respiratory effort CV: Regular rate and rhythm; no murmurs Abd: +bowel sounds, soft, non-tender, no masses Ext: Well-perfused; no edema Skin: Warm, dry; no sign of jaundice    Assessment & Plan Amanda Key K. Emma Birchler MD; 10/22/2014 10:43 AM)  BREAST MASS, RIGHT (611.72  N63)  Current Plans Pt Education - Breast Cancer in Women *: breast lumps Schedule for Surgery - Right radioactive seed-localized lumpectomy. The surgical procedure has been discussed with the patient. Potential risks, benefits, alternative treatments, and expected outcomes have been explained. All of the patient's questions at this time have been answered. The likelihood of reaching the patient's treatment goal is good. The patient understand the proposed surgical procedure and wishes to proceed.  Amanda Haas. Amanda Dover, MD, [facility]  General/ Trauma Surgery  10/22/2014 5:09 PM

## 2014-11-18 ENCOUNTER — Encounter (HOSPITAL_BASED_OUTPATIENT_CLINIC_OR_DEPARTMENT_OTHER): Payer: Self-pay | Admitting: Surgery

## 2014-11-19 ENCOUNTER — Other Ambulatory Visit: Payer: Self-pay | Admitting: Surgery

## 2014-11-19 ENCOUNTER — Ambulatory Visit: Payer: Self-pay | Admitting: Surgery

## 2014-11-19 NOTE — H&P (Signed)
History of Present Illness  Patient words: right breast eval.   Referred by Artemio Aly, NP for evaluation of right breast mass  This is a 59 yo female in good health who presents for evaluation of a new right breast mass. 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 She had a recent routine screening mammogram performed at [facility] and this showed a suspicious area in the right breast in the lower central portion. She underwent stereotactic biopsy which showed calcifications associated with atypical papillary lesion. A biopsy clip was placed. She presents now after recent right breast lumpectomy.  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 Prognostic panel pending.  I called the patient to discuss this with her. Recommend reexcision of the lateral margin. It is possible that the biopsy clip is still contained within the lateral margin. She would like to see oncology at [facility] which is closer to her house in [place].  Menarche -81 First pregnancy 25 Breastfeed - no OCP - 6 months FH - negative   Other Problems  Arthritis Asthma Diabetes Mellitus Gastric Ulcer Gastroesophageal Reflux Disease High blood pressure Hypercholesterolemia Lump In Breast Thyroid Disease  Past Surgical History Appendectomy Breast Biopsy Right. Knee Surgery Left.  Diagnostic Studies History Colonoscopy 1-5 years ago Mammogram within last year Pap Smear 1-5 years ago  Allergies  Lodine *ANALGESICS - ANTI-INFLAMMATORY*  Medication History  Atorvastatin Calcium (10MG  Tablet, Oral) Active. Hydrochlorothiazide (25MG  Tablet, Oral) Active. Pantoprazole Sodium (40MG  Tablet DR, Oral) Active. Levothyroxine Sodium (100MCG Tablet, Oral) Active. Medications Reconciled  Social History  Caffeine use Coffee, Tea. No alcohol use No drug use Tobacco use Never smoker.  Family History  Arthritis Father, Mother. Colon Cancer  Father. Colon Polyps Father. Diabetes Mellitus Father. Heart Disease Father. Hypertension Father, Mother. Malignant Neoplasm Of Pancreas Mother. Thyroid problems Mother.  Pregnancy / Birth History  Age at menarche 82 years. Age of menopause 27-50 Gravida 2 Maternal age 8-25 Para 2  Review of Systems General Not Present- Appetite Loss, Chills, Fatigue, Fever, Night Sweats, Weight Gain and Weight Loss. Skin Not Present- Change in Wart/Mole, Dryness, Hives, Jaundice, New Lesions, Non-Healing Wounds, Rash and Ulcer. HEENT Present- Wears glasses/contact lenses. Not Present- Earache, Hearing Loss, Hoarseness, Nose Bleed, Oral Ulcers, Ringing in the Ears, Seasonal Allergies, Sinus Pain, Sore Throat, Visual Disturbances and Yellow Eyes. Respiratory Not Present- Bloody sputum, Chronic Cough, Difficulty Breathing, Snoring and Wheezing. Breast Present- Breast Mass. Not Present- Breast Pain, Nipple Discharge and Skin Changes. Cardiovascular Not Present- Chest Pain, Difficulty Breathing Lying Down, Leg Cramps, Palpitations, Rapid Heart Rate, Shortness of Breath and Swelling of Extremities. Gastrointestinal Not Present- Abdominal Pain, Bloating, Bloody Stool, Change in Bowel Habits, Chronic diarrhea, Constipation, Difficulty Swallowing, Excessive gas, Gets full quickly at meals, Hemorrhoids, Indigestion, Nausea, Rectal Pain and Vomiting. Female Genitourinary Not Present- Frequency, Nocturia, Painful Urination, Pelvic Pain and Urgency. Musculoskeletal Present- Back Pain and Joint Pain. Not Present- Joint Stiffness, Muscle Pain, Muscle Weakness and Swelling of Extremities. Neurological Not Present- Decreased Memory, Fainting, Headaches, Numbness, Seizures, Tingling, Tremor, Trouble walking and Weakness. Psychiatric Not Present- Anxiety, Bipolar, Change in Sleep Pattern, Depression, Fearful and Frequent crying. Endocrine Not Present- Cold Intolerance, Excessive Hunger, Hair Changes, Heat  Intolerance, Hot flashes and New Diabetes. Hematology Not Present- Easy Bruising, Excessive bleeding, Gland problems, HIV and Persistent Infections.   Vitals  Weight: 178 lb Height: 67in Body Surface Area: 1.95 m Body Mass Index: 27.88 kg/m Temp.: 51F(Temporal)  BP: 130/76 (Sitting, Left Arm,  Standard)    Physical Exam  The physical exam findings are as follows: Note:WDWN in NAD HEENT: EOMI, sclera anicteric Neck: No masses, no thyromegaly Breasts: no palpable masses or lymphdenopathy on the left; no right lymphadenopathy; small resolving hematoma on the right inferior breast Lungs: CTA bilaterally; normal respiratory effort CV: Regular rate and rhythm; no murmurs Abd: +bowel sounds, soft, non-tender, no masses Ext: Well-perfused; no edema Skin: Warm, dry; no sign of jaundice    Assessment & Plan  BREAST MASS, RIGHT (611.72  [id number]) Current Plans  Schedule for Surgery - Reexcision of lateral margin - right breast lumpectomy. The surgical procedure has been discussed with the patient. Potential risks, benefits, alternative treatments, and expected outcomes have been explained. All of the patient's questions at this time have been answered. The likelihood of reaching the patient's treatment goal is good. The patient understand the proposed surgical procedure and wishes to proceed.

## 2014-11-22 ENCOUNTER — Encounter (HOSPITAL_BASED_OUTPATIENT_CLINIC_OR_DEPARTMENT_OTHER): Payer: Self-pay | Admitting: *Deleted

## 2014-11-26 DIAGNOSIS — D0511 Intraductal carcinoma in situ of right breast: Secondary | ICD-10-CM | POA: Diagnosis not present

## 2014-11-26 DIAGNOSIS — Z170 Estrogen receptor positive status [ER+]: Secondary | ICD-10-CM | POA: Diagnosis not present

## 2014-11-30 ENCOUNTER — Ambulatory Visit (HOSPITAL_BASED_OUTPATIENT_CLINIC_OR_DEPARTMENT_OTHER): Payer: BLUE CROSS/BLUE SHIELD | Admitting: Anesthesiology

## 2014-11-30 ENCOUNTER — Encounter (HOSPITAL_BASED_OUTPATIENT_CLINIC_OR_DEPARTMENT_OTHER)
Admission: RE | Disposition: A | Discharge status: Home / Self Care | Payer: Self-pay | Source: Ambulatory Visit | Attending: Surgery

## 2014-11-30 ENCOUNTER — Encounter (HOSPITAL_BASED_OUTPATIENT_CLINIC_OR_DEPARTMENT_OTHER): Payer: Self-pay | Admitting: *Deleted

## 2014-11-30 ENCOUNTER — Ambulatory Visit (HOSPITAL_BASED_OUTPATIENT_CLINIC_OR_DEPARTMENT_OTHER)
Admission: RE | Admit: 2014-11-30 | Discharge: 2014-11-30 | Disposition: A | Discharge status: Home / Self Care | Payer: BLUE CROSS/BLUE SHIELD | Source: Ambulatory Visit | Attending: Surgery | Admitting: Surgery

## 2014-11-30 DIAGNOSIS — Z79899 Other long term (current) drug therapy: Secondary | ICD-10-CM | POA: Insufficient documentation

## 2014-11-30 DIAGNOSIS — D0511 Intraductal carcinoma in situ of right breast: Secondary | ICD-10-CM | POA: Insufficient documentation

## 2014-11-30 HISTORY — PX: BREAST LUMPECTOMY: SHX2

## 2014-11-30 HISTORY — PX: RE-EXCISION OF BREAST LUMPECTOMY: SHX6048

## 2014-11-30 SURGERY — EXCISION, LESION, BREAST
Anesthesia: General | Site: Breast | Laterality: Right

## 2014-11-30 MED ORDER — ONDANSETRON HCL 4 MG/2ML IJ SOLN: 4.0000 mg | INTRAMUSCULAR | Status: DC | PRN

## 2014-11-30 MED ORDER — MEPERIDINE HCL 25 MG/ML IJ SOLN: 6.2500 mg | INTRAMUSCULAR | Status: DC | PRN

## 2014-11-30 MED ORDER — SCOPOLAMINE 1 MG/3DAYS TD PT72
MEDICATED_PATCH | TRANSDERMAL | Status: AC
  Filled 2014-11-30: qty 1

## 2014-11-30 MED ORDER — MIDAZOLAM HCL 2 MG/2ML IJ SOLN
INTRAMUSCULAR | Status: AC
  Filled 2014-11-30: qty 4

## 2014-11-30 MED ORDER — ONDANSETRON HCL 4 MG/2ML IJ SOLN
INTRAMUSCULAR | Status: DC | PRN
  Administered 2014-11-30: 4 mg via INTRAVENOUS

## 2014-11-30 MED ORDER — HYDROMORPHONE HCL 1 MG/ML IJ SOLN: 0.2500 mg | INTRAMUSCULAR | Status: DC | PRN

## 2014-11-30 MED ORDER — HYDROCODONE-ACETAMINOPHEN 5-325 MG PO TABS: 1.0000 | ORAL_TABLET | ORAL | Status: DC | PRN

## 2014-11-30 MED ORDER — CEFAZOLIN SODIUM-DEXTROSE 2-3 GM-% IV SOLR
INTRAVENOUS | Status: AC
  Filled 2014-11-30: qty 50

## 2014-11-30 MED ORDER — GLYCOPYRROLATE 0.2 MG/ML IJ SOLN: 0.2000 mg | Freq: Once | INTRAMUSCULAR | Status: DC | PRN

## 2014-11-30 MED ORDER — LIDOCAINE HCL (CARDIAC) 20 MG/ML IV SOLN
INTRAVENOUS | Status: DC | PRN
  Administered 2014-11-30: 75 mg via INTRAVENOUS

## 2014-11-30 MED ORDER — DEXAMETHASONE SODIUM PHOSPHATE 10 MG/ML IJ SOLN
INTRAMUSCULAR | Status: AC
  Filled 2014-11-30: qty 1

## 2014-11-30 MED ORDER — CHLORHEXIDINE GLUCONATE 4 % EX LIQD: 1.0000 "application " | Freq: Once | CUTANEOUS | Status: DC

## 2014-11-30 MED ORDER — PROPOFOL 10 MG/ML IV BOLUS
INTRAVENOUS | Status: DC | PRN
  Administered 2014-11-30: 200 mg via INTRAVENOUS

## 2014-11-30 MED ORDER — SCOPOLAMINE 1 MG/3DAYS TD PT72
1.0000 | MEDICATED_PATCH | Freq: Once | TRANSDERMAL | Status: AC | PRN
  Administered 2014-11-30: 1 via TRANSDERMAL

## 2014-11-30 MED ORDER — LIDOCAINE HCL (CARDIAC) 20 MG/ML IV SOLN
INTRAVENOUS | Status: AC
  Filled 2014-11-30: qty 5

## 2014-11-30 MED ORDER — CEFAZOLIN SODIUM-DEXTROSE 2-3 GM-% IV SOLR
2.0000 g | INTRAVENOUS | Status: AC
  Administered 2014-11-30: 2 g via INTRAVENOUS

## 2014-11-30 MED ORDER — ONDANSETRON HCL 4 MG/2ML IJ SOLN
INTRAMUSCULAR | Status: AC
  Filled 2014-11-30: qty 2

## 2014-11-30 MED ORDER — BUPIVACAINE-EPINEPHRINE 0.25% -1:200000 IJ SOLN
INTRAMUSCULAR | Status: DC | PRN
Start: 2014-11-30 — End: 2014-11-30
  Administered 2014-11-30: 10 mL

## 2014-11-30 MED ORDER — OXYCODONE HCL 5 MG PO TABS: 5.0000 mg | ORAL_TABLET | Freq: Once | ORAL | Status: DC | PRN

## 2014-11-30 MED ORDER — OXYCODONE HCL 5 MG/5ML PO SOLN: 5.0000 mg | Freq: Once | ORAL | Status: DC | PRN

## 2014-11-30 MED ORDER — FENTANYL CITRATE (PF) 100 MCG/2ML IJ SOLN
50.0000 ug | INTRAMUSCULAR | Status: DC | PRN
  Administered 2014-11-30: 100 ug via INTRAVENOUS

## 2014-11-30 MED ORDER — MIDAZOLAM HCL 2 MG/2ML IJ SOLN
1.0000 mg | INTRAMUSCULAR | Status: DC | PRN
  Administered 2014-11-30: 2 mg via INTRAVENOUS

## 2014-11-30 MED ORDER — MORPHINE SULFATE (PF) 2 MG/ML IV SOLN: 2.0000 mg | INTRAVENOUS | Status: DC | PRN

## 2014-11-30 MED ORDER — DEXAMETHASONE SODIUM PHOSPHATE 4 MG/ML IJ SOLN
INTRAMUSCULAR | Status: DC | PRN
  Administered 2014-11-30: 10 mg via INTRAVENOUS

## 2014-11-30 MED ORDER — ACETAMINOPHEN 325 MG PO TABS
ORAL_TABLET | ORAL | Status: AC
Start: 2014-11-30 — End: 2014-11-30
  Filled 2014-11-30: qty 1

## 2014-11-30 MED ORDER — PROPOFOL 500 MG/50ML IV EMUL
INTRAVENOUS | Status: AC
  Filled 2014-11-30: qty 50

## 2014-11-30 MED ORDER — LACTATED RINGERS IV SOLN
INTRAVENOUS | Status: DC
  Administered 2014-11-30: 10:00:00 via INTRAVENOUS
  Administered 2014-11-30: 10 mL/h via INTRAVENOUS

## 2014-11-30 MED ORDER — FENTANYL CITRATE (PF) 100 MCG/2ML IJ SOLN
INTRAMUSCULAR | Status: AC
  Filled 2014-11-30: qty 4

## 2014-11-30 SURGICAL SUPPLY — 45 items
APPLIER CLIP 9.375 MED OPEN (MISCELLANEOUS) ×2
BENZOIN TINCTURE PRP APPL 2/3 (GAUZE/BANDAGES/DRESSINGS) ×2 IMPLANT
BLADE HEX COATED 2.75 (ELECTRODE) ×2 IMPLANT
BLADE SURG 15 STRL LF DISP TIS (BLADE) ×1 IMPLANT
BLADE SURG 15 STRL SS (BLADE) ×1
CANISTER SUCT 1200ML W/VALVE (MISCELLANEOUS) ×2 IMPLANT
CHLORAPREP W/TINT 26ML (MISCELLANEOUS) ×2 IMPLANT
CLIP APPLIE 9.375 MED OPEN (MISCELLANEOUS) ×1 IMPLANT
COVER BACK TABLE 60X90IN (DRAPES) ×2 IMPLANT
COVER MAYO STAND STRL (DRAPES) ×2 IMPLANT
DECANTER SPIKE VIAL GLASS SM (MISCELLANEOUS) IMPLANT
DEVICE DUBIN W/COMP PLATE 8390 (MISCELLANEOUS) ×2 IMPLANT
DRAPE LAPAROTOMY 100X72 PEDS (DRAPES) ×2 IMPLANT
DRAPE UTILITY XL STRL (DRAPES) ×2 IMPLANT
DRSG TEGADERM 4X4.75 (GAUZE/BANDAGES/DRESSINGS) ×2 IMPLANT
ELECT REM PT RETURN 9FT ADLT (ELECTROSURGICAL) ×2
ELECTRODE REM PT RTRN 9FT ADLT (ELECTROSURGICAL) ×1 IMPLANT
GLOVE BIO SURGEON STRL SZ 6.5 (GLOVE) ×2 IMPLANT
GLOVE BIO SURGEON STRL SZ7 (GLOVE) ×2 IMPLANT
GLOVE BIOGEL PI IND STRL 7.0 (GLOVE) ×1 IMPLANT
GLOVE BIOGEL PI IND STRL 7.5 (GLOVE) ×1 IMPLANT
GLOVE BIOGEL PI INDICATOR 7.0 (GLOVE) ×1
GLOVE BIOGEL PI INDICATOR 7.5 (GLOVE) ×1
GLOVE EXAM NITRILE EXT CUFF MD (GLOVE) ×2 IMPLANT
GOWN STRL REUS W/ TWL LRG LVL3 (GOWN DISPOSABLE) ×2 IMPLANT
GOWN STRL REUS W/TWL LRG LVL3 (GOWN DISPOSABLE) ×2
KIT MARKER MARGIN INK (KITS) ×2 IMPLANT
NEEDLE HYPO 25X1 1.5 SAFETY (NEEDLE) ×2 IMPLANT
NS IRRIG 1000ML POUR BTL (IV SOLUTION) ×2 IMPLANT
PACK BASIN DAY SURGERY FS (CUSTOM PROCEDURE TRAY) ×2 IMPLANT
PENCIL BUTTON HOLSTER BLD 10FT (ELECTRODE) ×2 IMPLANT
SLEEVE SCD COMPRESS KNEE MED (MISCELLANEOUS) ×2 IMPLANT
SPONGE GAUZE 4X4 12PLY STER LF (GAUZE/BANDAGES/DRESSINGS) IMPLANT
SPONGE LAP 4X18 X RAY DECT (DISPOSABLE) ×2 IMPLANT
STRIP CLOSURE SKIN 1/2X4 (GAUZE/BANDAGES/DRESSINGS) ×2 IMPLANT
SUT CHROMIC 3 0 SH 27 (SUTURE) IMPLANT
SUT MON AB 4-0 PC3 18 (SUTURE) ×2 IMPLANT
SUT SILK 2 0 SH (SUTURE) IMPLANT
SUT VIC AB 3-0 SH 27 (SUTURE) ×1
SUT VIC AB 3-0 SH 27X BRD (SUTURE) ×1 IMPLANT
SYR CONTROL 10ML LL (SYRINGE) ×2 IMPLANT
TOWEL OR 17X24 6PK STRL BLUE (TOWEL DISPOSABLE) ×2 IMPLANT
TOWEL OR NON WOVEN STRL DISP B (DISPOSABLE) IMPLANT
TUBE CONNECTING 20X1/4 (TUBING) ×2 IMPLANT
YANKAUER SUCT BULB TIP NO VENT (SUCTIONS) ×2 IMPLANT

## 2014-11-30 NOTE — Op Note (Signed)
Pre-op diagnosis:  DCIS right breast Post-op diagnosis:  Same Procedure:  Reexcision lateral margin right breast Surgeon:  Maia Petties. Anesthesia:  GEN - LMA Indications:  59 yo female s/p right radioactive seed-localized lumpectomy on 11/17/14.  Pathology showed a 0.8 cm area of DCIS but the lateral margin was focally positive.  The biopsy clip was not identified at that time.  She returns now for reexcision of the lateral margin.  Description of procedure:  The patient was brought to the OR and was placed in a supine position on the OR table.  After an adequate level of general anesthesia was obtained, her right breast was prepped with Chloraprep and draped in sterile fashion.  A timeout was taken to ensure the proper patient and proper procedure.  We infiltrated the area around her previous incision with 0.25% Marcaine with epinephrine. Her incision was opened. We dissected down into the seroma cavity which was evacuated. We excised an additional 2 cm of lateral margin including part of the superior and inferior margins. The specimen was oriented with a paint kit. This was examined with a specimen mammogram but the biopsy clip was not noted. The specimen was sent for pathologic examination. We inspected the biopsy cavity thoroughly. I cannot palpate any biopsy clip. There are no other masses noted. Hemostasis was good. We irrigated thoroughly and inspected again for hemostasis. The posterior margin is all the way on the chest wall. We placed 5 clips to mark the margins of the biopsy cavity. The wound was closed with a subcutaneous layer of 3-0 Vicryl. 4-0 Monocryl was used to close the skin. Steri-Strips and clean dressings were applied. The patient was then next patient brought to recovery in stable condition. All sponge, initially, and needle counts are correct.  [name]. Georgette Dover, MD, [facility]  General/ Trauma Surgery  11/30/2014 11:14 AM

## 2014-11-30 NOTE — Anesthesia Procedure Notes (Signed)
Procedure Name: LMA Insertion Date/Time: 11/30/2014 10:31 AM Performed by: Melynda Ripple D Pre-anesthesia Checklist: Patient identified, Emergency Drugs available, Suction available and Patient being monitored Patient Re-evaluated:Patient Re-evaluated prior to inductionOxygen Delivery Method: Circle System Utilized Preoxygenation: Pre-oxygenation with 100% oxygen Intubation Type: IV induction Ventilation: Mask ventilation without difficulty LMA: LMA inserted LMA Size: 4.0 Number of attempts: 1 Airway Equipment and Method: Bite block Placement Confirmation: positive ETCO2 Tube secured with: Tape Dental Injury: Teeth and Oropharynx as per pre-operative assessment

## 2014-11-30 NOTE — Interval H&P Note (Signed)
History and Physical Interval Note:  11/30/2014 9:31 AM  Amanda Haas  has presented today for surgery, with the diagnosis of Right Breast DCIS  The various methods of treatment have been discussed with the patient and family. After consideration of risks, benefits and other options for treatment, the patient has consented to  Procedure(s): RE-EXCISION OF RIGHT BREAST LUMPECTOMY (Right) as a surgical intervention .  The patient's history has been reviewed, patient examined, no change in status, stable for surgery.  I have reviewed the patient's chart and labs.  Questions were answered to the patient's satisfaction.     [name]

## 2014-11-30 NOTE — Anesthesia Postprocedure Evaluation (Signed)
  Anesthesia Post-op Note  Patient: Amanda Haas  Procedure(s) Performed: Procedure(s): RE-EXCISION OF RIGHT BREAST LUMPECTOMY (Right)  Patient Location: PACU  Anesthesia Type: General   Level of Consciousness: awake, alert  and oriented  Airway and Oxygen Therapy: Patient Spontanous Breathing  Post-op Pain: none  Post-op Assessment: Post-op Vital signs reviewed  Post-op Vital Signs: Reviewed  Last Vitals:  Filed Vitals:   11/30/14 1230  BP: 120/80  Pulse: 88  Temp: 36.8 C  Resp: 20    Complications: No apparent anesthesia complications

## 2014-11-30 NOTE — Anesthesia Preprocedure Evaluation (Signed)
Anesthesia Evaluation  Patient identified by MRN, date of birth, ID band Patient awake    Reviewed: Allergy & Precautions, NPO status , Patient's Chart, lab work & pertinent test results  History of Anesthesia Complications (+) PONV  Airway Mallampati: I  TM Distance: >3 FB Neck ROM: Full    Dental  (+) Teeth Intact, Dental Advisory Given   Pulmonary  breath sounds clear to auscultation        Cardiovascular hypertension, Pt. on medications Rhythm:Regular Rate:Normal     Neuro/Psych    GI/Hepatic GERD-  ,  Endo/Other    Renal/GU      Musculoskeletal   Abdominal   Peds  Hematology   Anesthesia Other Findings   Reproductive/Obstetrics                             Anesthesia Physical Anesthesia Plan  ASA: II  Anesthesia Plan: General   Post-op Pain Management:    Induction: Intravenous  Airway Management Planned: LMA  Additional Equipment:   Intra-op Plan:   Post-operative Plan: Extubation in OR  Informed Consent: I have reviewed the patients History and Physical, chart, labs and discussed the procedure including the risks, benefits and alternatives for the proposed anesthesia with the patient or authorized representative who has indicated his/her understanding and acceptance.   Dental advisory given  Plan Discussed with: CRNA, Anesthesiologist and Surgeon  Anesthesia Plan Comments:         Anesthesia Quick Evaluation  

## 2014-11-30 NOTE — Transfer of Care (Signed)
Immediate Anesthesia Transfer of Care Note  Patient: Amanda Haas  Procedure(s) Performed: Procedure(s): RE-EXCISION OF RIGHT BREAST LUMPECTOMY (Right)  Patient Location: PACU  Anesthesia Type:General  Level of Consciousness: sedated and responds to stimulation  Airway & Oxygen Therapy: Patient Spontanous Breathing and Patient connected to face mask oxygen  Post-op Assessment: Report given to RN  Post vital signs: Reviewed and stable  Last Vitals:  Filed Vitals:   11/30/14 0924  BP:   Pulse:   Temp: 36.7 C  Resp:     Complications: No apparent anesthesia complications

## 2014-11-30 NOTE — Discharge Instructions (Signed)
[facility] °Office Phone Number [phone] ° °BREAST BIOPSY/ PARTIAL MASTECTOMY: POST OP INSTRUCTIONS ° °Always review your discharge instruction sheet given to you by the facility where your surgery was performed. ° °IF YOU HAVE DISABILITY OR FAMILY LEAVE FORMS, YOU MUST BRING THEM TO THE OFFICE FOR PROCESSING.  DO NOT GIVE THEM TO YOUR DOCTOR. ° °1. A prescription for pain medication may be given to you upon discharge.  Take your pain medication as prescribed, if needed.  If narcotic pain medicine is not needed, then you may take acetaminophen (Tylenol) or ibuprofen (Advil) as needed. °2. Take your usually prescribed medications unless otherwise directed °3. If you need a refill on your pain medication, please contact your pharmacy.  They will contact our office to request authorization.  Prescriptions will not be filled after 5pm or on week-ends. °4. You should eat very light the first 24 hours after surgery, such as soup, crackers, pudding, etc.  Resume your normal diet the day after surgery. °5. Most patients will experience some swelling and bruising in the breast.  Ice packs and a good support bra will help.  Swelling and bruising can take several days to resolve.  °6. It is common to experience some constipation if taking pain medication after surgery.  Increasing fluid intake and taking a stool softener will usually help or prevent this problem from occurring.  A mild laxative (Milk of Magnesia or Miralax) should be taken according to package directions if there are no bowel movements after 48 hours. °7. Unless discharge instructions indicate otherwise, you may remove your bandages 24-48 hours after surgery, and you may shower at that time.  You may have steri-strips (small skin tapes) in place directly over the incision.  These strips should be left on the skin for 7-10 days.  If your surgeon used skin glue on the incision, you may shower in 24 hours.  The glue will flake off over the  next 2-3 weeks.  Any sutures or staples will be removed at the office during your follow-up visit. °8. ACTIVITIES:  You may resume regular daily activities (gradually increasing) beginning the next day.  Wearing a good support bra or sports bra minimizes pain and swelling.  You may have sexual intercourse when it is comfortable. °a. You may drive when you no longer are taking prescription pain medication, you can comfortably wear a seatbelt, and you can safely maneuver your car and apply brakes. °b. RETURN TO WORK:  ______________________________________________________________________________________ °9. You should see your doctor in the office for a follow-up appointment approximately two weeks after your surgery.  Your doctor’s nurse will typically make your follow-up appointment when she calls you with your pathology report.  Expect your pathology report 2-3 business days after your surgery.  You may call to check if you do not hear from us after three days. °10. OTHER INSTRUCTIONS: _______________________________________________________________________________________________ _____________________________________________________________________________________________________________________________________ °_____________________________________________________________________________________________________________________________________ °_____________________________________________________________________________________________________________________________________ ° °WHEN TO CALL YOUR DOCTOR: °1. Fever over 101.0 °2. Nausea and/or vomiting. °3. Extreme swelling or bruising. °4. Continued bleeding from incision. °5. Increased pain, redness, or drainage from the incision. ° °The clinic staff is available to answer your questions during regular business hours.  Please don’t hesitate to call and ask to speak to one of the nurses for clinical concerns.  If you have a medical emergency, go to the nearest  emergency room or call 911.  A surgeon from [facility] is always on call at the hospital. ° °For further questions, please visit [email or web address]  ° ° ° °  Post Anesthesia Home Care Instructions ° °Activity: °Get plenty of rest for the remainder of the day. A responsible adult should stay with you for 24 hours following the procedure.  °For the next 24 hours, DO NOT: °-Drive a car °-Operate machinery °-Drink alcoholic beverages °-Take any medication unless instructed by your physician °-Make any legal decisions or sign important papers. ° °Meals: °Start with liquid foods such as gelatin or soup. Progress to regular foods as tolerated. Avoid greasy, spicy, heavy foods. If nausea and/or vomiting occur, drink only clear liquids until the nausea and/or vomiting subsides. Call your physician if vomiting continues. ° °Special Instructions/Symptoms: °Your throat may feel dry or sore from the anesthesia or the breathing tube placed in your throat during surgery. If this causes discomfort, gargle with warm salt water. The discomfort should disappear within 24 hours. ° °If you had a scopolamine patch placed behind your ear for the management of post- operative nausea and/or vomiting: ° °1. The medication in the patch is effective for 72 hours, after which it should be removed.  Wrap patch in a tissue and discard in the trash. Wash hands thoroughly with soap and water. °2. You may remove the patch earlier than 72 hours if you experience unpleasant side effects which may include dry mouth, dizziness or visual disturbances. °3. Avoid touching the patch. Wash your hands with soap and water after contact with the patch. °  ° °

## 2014-11-30 NOTE — H&P (View-Only) (Signed)
History of Present Illness  Patient words: right breast eval.   Referred by Artemio Aly, NP for evaluation of right breast mass  This is a 59 yo female in good health who presents for evaluation of a new right breast mass. 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 She had a recent routine screening mammogram performed at [facility] and this showed a suspicious area in the right breast in the lower central portion. She underwent stereotactic biopsy which showed calcifications associated with atypical papillary lesion. A biopsy clip was placed. She presents now after recent right breast lumpectomy.  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 Prognostic panel pending.  I called the patient to discuss this with her. Recommend reexcision of the lateral margin. It is possible that the biopsy clip is still contained within the lateral margin. She would like to see oncology at [facility] which is closer to her house in [place].  Menarche -89 First pregnancy 25 Breastfeed - no OCP - 6 months FH - negative   Other Problems  Arthritis Asthma Diabetes Mellitus Gastric Ulcer Gastroesophageal Reflux Disease High blood pressure Hypercholesterolemia Lump In Breast Thyroid Disease  Past Surgical History Appendectomy Breast Biopsy Right. Knee Surgery Left.  Diagnostic Studies History Colonoscopy 1-5 years ago Mammogram within last year Pap Smear 1-5 years ago  Allergies  Lodine *ANALGESICS - ANTI-INFLAMMATORY*  Medication History  Atorvastatin Calcium (10MG  Tablet, Oral) Active. Hydrochlorothiazide (25MG  Tablet, Oral) Active. Pantoprazole Sodium (40MG  Tablet DR, Oral) Active. Levothyroxine Sodium (100MCG Tablet, Oral) Active. Medications Reconciled  Social History  Caffeine use Coffee, Tea. No alcohol use No drug use Tobacco use Never smoker.  Family History  Arthritis Father, Mother. Colon Cancer  Father. Colon Polyps Father. Diabetes Mellitus Father. Heart Disease Father. Hypertension Father, Mother. Malignant Neoplasm Of Pancreas Mother. Thyroid problems Mother.  Pregnancy / Birth History  Age at menarche 64 years. Age of menopause 60-50 Gravida 2 Maternal age 52-25 Para 2  Review of Systems General Not Present- Appetite Loss, Chills, Fatigue, Fever, Night Sweats, Weight Gain and Weight Loss. Skin Not Present- Change in Wart/Mole, Dryness, Hives, Jaundice, New Lesions, Non-Healing Wounds, Rash and Ulcer. HEENT Present- Wears glasses/contact lenses. Not Present- Earache, Hearing Loss, Hoarseness, Nose Bleed, Oral Ulcers, Ringing in the Ears, Seasonal Allergies, Sinus Pain, Sore Throat, Visual Disturbances and Yellow Eyes. Respiratory Not Present- Bloody sputum, Chronic Cough, Difficulty Breathing, Snoring and Wheezing. Breast Present- Breast Mass. Not Present- Breast Pain, Nipple Discharge and Skin Changes. Cardiovascular Not Present- Chest Pain, Difficulty Breathing Lying Down, Leg Cramps, Palpitations, Rapid Heart Rate, Shortness of Breath and Swelling of Extremities. Gastrointestinal Not Present- Abdominal Pain, Bloating, Bloody Stool, Change in Bowel Habits, Chronic diarrhea, Constipation, Difficulty Swallowing, Excessive gas, Gets full quickly at meals, Hemorrhoids, Indigestion, Nausea, Rectal Pain and Vomiting. Female Genitourinary Not Present- Frequency, Nocturia, Painful Urination, Pelvic Pain and Urgency. Musculoskeletal Present- Back Pain and Joint Pain. Not Present- Joint Stiffness, Muscle Pain, Muscle Weakness and Swelling of Extremities. Neurological Not Present- Decreased Memory, Fainting, Headaches, Numbness, Seizures, Tingling, Tremor, Trouble walking and Weakness. Psychiatric Not Present- Anxiety, Bipolar, Change in Sleep Pattern, Depression, Fearful and Frequent crying. Endocrine Not Present- Cold Intolerance, Excessive Hunger, Hair Changes, Heat  Intolerance, Hot flashes and New Diabetes. Hematology Not Present- Easy Bruising, Excessive bleeding, Gland problems, HIV and Persistent Infections.   Vitals  Weight: 178 lb Height: 67in Body Surface Area: 1.95 m Body Mass Index: 27.88 kg/m Temp.: 27F(Temporal)  BP: 130/76 (Sitting, Left Arm,  Standard)    Physical Exam  The physical exam findings are as follows: Note:WDWN in NAD HEENT: EOMI, sclera anicteric Neck: No masses, no thyromegaly Breasts: no palpable masses or lymphdenopathy on the left; no right lymphadenopathy; small resolving hematoma on the right inferior breast Lungs: CTA bilaterally; normal respiratory effort CV: Regular rate and rhythm; no murmurs Abd: +bowel sounds, soft, non-tender, no masses Ext: Well-perfused; no edema Skin: Warm, dry; no sign of jaundice    Assessment & Plan  BREAST MASS, RIGHT (611.72  N63) Current Plans  Schedule for Surgery - Reexcision of lateral margin - right breast lumpectomy. The surgical procedure has been discussed with the patient. Potential risks, benefits, alternative treatments, and expected outcomes have been explained. All of the patient's questions at this time have been answered. The likelihood of reaching the patient's treatment goal is good. The patient understand the proposed surgical procedure and wishes to proceed.

## 2014-12-01 ENCOUNTER — Encounter (HOSPITAL_BASED_OUTPATIENT_CLINIC_OR_DEPARTMENT_OTHER): Payer: Self-pay | Admitting: Surgery

## 2015-01-05 LAB — HM DEXA SCAN

## 2015-02-23 DIAGNOSIS — D0510 Intraductal carcinoma in situ of unspecified breast: Secondary | ICD-10-CM

## 2015-02-23 DIAGNOSIS — M8580 Other specified disorders of bone density and structure, unspecified site: Secondary | ICD-10-CM | POA: Diagnosis not present

## 2015-05-25 DIAGNOSIS — Z853 Personal history of malignant neoplasm of breast: Secondary | ICD-10-CM

## 2015-09-23 DIAGNOSIS — M8589 Other specified disorders of bone density and structure, multiple sites: Secondary | ICD-10-CM

## 2015-09-23 DIAGNOSIS — D0511 Intraductal carcinoma in situ of right breast: Secondary | ICD-10-CM | POA: Diagnosis not present

## 2015-10-19 DIAGNOSIS — J4520 Mild intermittent asthma, uncomplicated: Secondary | ICD-10-CM | POA: Insufficient documentation

## 2015-10-19 DIAGNOSIS — Z79899 Other long term (current) drug therapy: Secondary | ICD-10-CM | POA: Insufficient documentation

## 2015-10-19 DIAGNOSIS — K219 Gastro-esophageal reflux disease without esophagitis: Secondary | ICD-10-CM

## 2015-10-19 DIAGNOSIS — E039 Hypothyroidism, unspecified: Secondary | ICD-10-CM | POA: Insufficient documentation

## 2015-10-19 DIAGNOSIS — I10 Essential (primary) hypertension: Secondary | ICD-10-CM

## 2015-10-19 DIAGNOSIS — Z853 Personal history of malignant neoplasm of breast: Secondary | ICD-10-CM | POA: Insufficient documentation

## 2015-10-19 DIAGNOSIS — E559 Vitamin D deficiency, unspecified: Secondary | ICD-10-CM

## 2015-10-19 DIAGNOSIS — H8109 Meniere's disease, unspecified ear: Secondary | ICD-10-CM

## 2015-10-19 HISTORY — DX: Personal history of malignant neoplasm of breast: Z85.3

## 2015-10-19 HISTORY — DX: Meniere's disease, unspecified ear: H81.09

## 2015-10-19 HISTORY — DX: Mild intermittent asthma, uncomplicated: J45.20

## 2015-10-19 HISTORY — DX: Other long term (current) drug therapy: Z79.899

## 2015-10-19 HISTORY — DX: Vitamin D deficiency, unspecified: E55.9

## 2015-10-19 HISTORY — DX: Gastro-esophageal reflux disease without esophagitis: K21.9

## 2015-10-19 HISTORY — DX: Hypothyroidism, unspecified: E03.9

## 2015-10-19 HISTORY — DX: Essential (primary) hypertension: I10

## 2016-02-20 DIAGNOSIS — C50919 Malignant neoplasm of unspecified site of unspecified female breast: Secondary | ICD-10-CM

## 2016-02-20 DIAGNOSIS — Z923 Personal history of irradiation: Secondary | ICD-10-CM

## 2016-02-20 HISTORY — PX: BREAST LUMPECTOMY: SHX2

## 2016-02-20 HISTORY — DX: Malignant neoplasm of unspecified site of unspecified female breast: C50.919

## 2016-02-20 HISTORY — DX: Personal history of irradiation: Z92.3

## 2016-04-13 DIAGNOSIS — Z86000 Personal history of in-situ neoplasm of breast: Secondary | ICD-10-CM | POA: Diagnosis not present

## 2016-10-05 DIAGNOSIS — Z923 Personal history of irradiation: Secondary | ICD-10-CM | POA: Diagnosis not present

## 2016-10-05 DIAGNOSIS — D0511 Intraductal carcinoma in situ of right breast: Secondary | ICD-10-CM | POA: Diagnosis not present

## 2016-10-05 DIAGNOSIS — Z79810 Long term (current) use of selective estrogen receptor modulators (SERMs): Secondary | ICD-10-CM | POA: Diagnosis not present

## 2017-01-14 MED FILL — RALOXIFENE HCL 60 MG TABLET: 60 | 30 days supply | Qty: 30 | Fill #0

## 2017-01-30 DIAGNOSIS — Z86000 Personal history of in-situ neoplasm of breast: Secondary | ICD-10-CM | POA: Diagnosis not present

## 2017-02-13 MED FILL — HYDROCHLOROTHIAZIDE 25 MG T: 25 | 90 days supply | Qty: 90 | Fill #0

## 2017-02-13 MED FILL — RALOXIFENE HCL 60 MG TABLET: 60 | 30 days supply | Qty: 30 | Fill #1

## 2017-02-13 MED FILL — LEVOTHYROXINE 100 MCG TABLE: 100 | 90 days supply | Qty: 90 | Fill #0

## 2017-03-12 MED FILL — RALOXIFENE HCL 60 MG TABLET: 60 | 30 days supply | Qty: 30 | Fill #2

## 2017-03-13 DIAGNOSIS — H8103 Meniere's disease, bilateral: Secondary | ICD-10-CM | POA: Diagnosis not present

## 2017-03-13 DIAGNOSIS — I10 Essential (primary) hypertension: Secondary | ICD-10-CM | POA: Diagnosis not present

## 2017-03-13 DIAGNOSIS — K219 Gastro-esophageal reflux disease without esophagitis: Secondary | ICD-10-CM | POA: Diagnosis not present

## 2017-03-13 DIAGNOSIS — E039 Hypothyroidism, unspecified: Secondary | ICD-10-CM | POA: Diagnosis not present

## 2017-03-13 DIAGNOSIS — E782 Mixed hyperlipidemia: Secondary | ICD-10-CM | POA: Diagnosis not present

## 2017-03-13 DIAGNOSIS — R7303 Prediabetes: Secondary | ICD-10-CM | POA: Diagnosis not present

## 2017-03-13 DIAGNOSIS — J4520 Mild intermittent asthma, uncomplicated: Secondary | ICD-10-CM | POA: Diagnosis not present

## 2017-03-13 DIAGNOSIS — Z79899 Other long term (current) drug therapy: Secondary | ICD-10-CM | POA: Diagnosis not present

## 2017-03-13 DIAGNOSIS — D0511 Intraductal carcinoma in situ of right breast: Secondary | ICD-10-CM | POA: Diagnosis not present

## 2017-03-13 DIAGNOSIS — E559 Vitamin D deficiency, unspecified: Secondary | ICD-10-CM | POA: Diagnosis not present

## 2017-03-27 MED FILL — CLORAZEPATE 3.75 MG TABLET: 3.75 | 20 days supply | Qty: 60 | Fill #0

## 2017-03-27 MED FILL — PANTOPRAZOLE SOD DR 40 MG T: 40 | 90 days supply | Qty: 90 | Fill #0

## 2017-04-01 DIAGNOSIS — N3001 Acute cystitis with hematuria: Secondary | ICD-10-CM | POA: Diagnosis not present

## 2017-04-01 DIAGNOSIS — M545 Low back pain: Secondary | ICD-10-CM | POA: Diagnosis not present

## 2017-04-04 DIAGNOSIS — N3000 Acute cystitis without hematuria: Secondary | ICD-10-CM | POA: Diagnosis not present

## 2017-04-04 DIAGNOSIS — M546 Pain in thoracic spine: Secondary | ICD-10-CM | POA: Diagnosis not present

## 2017-04-04 DIAGNOSIS — M545 Low back pain: Secondary | ICD-10-CM | POA: Diagnosis not present

## 2017-04-12 MED FILL — RALOXIFENE HCL 60 MG TABLET: 60 | 30 days supply | Qty: 30 | Fill #3

## 2017-05-15 MED FILL — RALOXIFENE HCL 60 MG TABLET: 60 | 30 days supply | Qty: 30 | Fill #4

## 2017-05-15 MED FILL — HYDROCHLOROTHIAZIDE 25 MG T: 25 | 90 days supply | Qty: 90 | Fill #0

## 2017-05-15 MED FILL — LEVOTHYROXINE 100 MCG TABLE: 100 | 90 days supply | Qty: 90 | Fill #0

## 2017-06-14 DIAGNOSIS — Z79810 Long term (current) use of selective estrogen receptor modulators (SERMs): Secondary | ICD-10-CM

## 2017-06-14 DIAGNOSIS — Z923 Personal history of irradiation: Secondary | ICD-10-CM

## 2017-06-14 DIAGNOSIS — D0511 Intraductal carcinoma in situ of right breast: Secondary | ICD-10-CM

## 2017-06-14 DIAGNOSIS — Z86000 Personal history of in-situ neoplasm of breast: Secondary | ICD-10-CM | POA: Diagnosis not present

## 2017-06-14 MED FILL — RALOXIFENE HCL 60 MG TABLET: 60 | 90 days supply | Qty: 90 | Fill #0

## 2017-06-18 ENCOUNTER — Other Ambulatory Visit: Payer: Self-pay | Admitting: Oncology

## 2017-06-18 DIAGNOSIS — D0511 Intraductal carcinoma in situ of right breast: Secondary | ICD-10-CM

## 2017-07-23 DIAGNOSIS — N3001 Acute cystitis with hematuria: Secondary | ICD-10-CM | POA: Diagnosis not present

## 2017-08-07 DIAGNOSIS — E559 Vitamin D deficiency, unspecified: Secondary | ICD-10-CM | POA: Diagnosis not present

## 2017-08-07 DIAGNOSIS — R7303 Prediabetes: Secondary | ICD-10-CM | POA: Diagnosis not present

## 2017-08-07 DIAGNOSIS — D0511 Intraductal carcinoma in situ of right breast: Secondary | ICD-10-CM | POA: Diagnosis not present

## 2017-08-07 DIAGNOSIS — H8103 Meniere's disease, bilateral: Secondary | ICD-10-CM | POA: Diagnosis not present

## 2017-08-07 DIAGNOSIS — E039 Hypothyroidism, unspecified: Secondary | ICD-10-CM | POA: Diagnosis not present

## 2017-08-07 DIAGNOSIS — Z0000 Encounter for general adult medical examination without abnormal findings: Secondary | ICD-10-CM | POA: Diagnosis not present

## 2017-08-07 DIAGNOSIS — Z79899 Other long term (current) drug therapy: Secondary | ICD-10-CM | POA: Diagnosis not present

## 2017-08-07 DIAGNOSIS — I10 Essential (primary) hypertension: Secondary | ICD-10-CM | POA: Diagnosis not present

## 2017-08-07 DIAGNOSIS — K219 Gastro-esophageal reflux disease without esophagitis: Secondary | ICD-10-CM | POA: Diagnosis not present

## 2017-08-07 DIAGNOSIS — J4520 Mild intermittent asthma, uncomplicated: Secondary | ICD-10-CM | POA: Diagnosis not present

## 2017-08-08 MED FILL — PROMETHAZINE 25 MG TABLET: 25 | 8 days supply | Qty: 30 | Fill #0

## 2017-08-08 MED FILL — CLORAZEPATE 3.75 MG TABLET: 3.75 | 20 days supply | Qty: 60 | Fill #0

## 2017-08-15 MED FILL — ATORVASTATIN 10 MG TABLET: 10 | 90 days supply | Qty: 90 | Fill #0

## 2017-08-16 MED FILL — LEVOTHYROXINE 100 MCG TABLE: 100 | 90 days supply | Qty: 90 | Fill #0

## 2017-08-16 MED FILL — HYDROCHLOROTHIAZIDE 25 MG T: 25 | 90 days supply | Qty: 90 | Fill #0

## 2017-09-19 MED FILL — PANTOPRAZOLE SOD DR 40 MG T: 40 | 90 days supply | Qty: 90 | Fill #1

## 2017-09-19 MED FILL — RALOXIFENE HCL 60 MG TABS: 60 | 90 days supply | Qty: 90 | Fill #1

## 2017-09-25 ENCOUNTER — Ambulatory Visit
Admission: RE | Admit: 2017-09-25 | Discharge: 2017-09-25 | Disposition: A | Discharge status: Home / Self Care | Payer: BLUE CROSS/BLUE SHIELD | Source: Ambulatory Visit | Attending: Oncology | Admitting: Oncology

## 2017-09-25 DIAGNOSIS — R928 Other abnormal and inconclusive findings on diagnostic imaging of breast: Secondary | ICD-10-CM | POA: Diagnosis not present

## 2017-09-25 DIAGNOSIS — D0511 Intraductal carcinoma in situ of right breast: Secondary | ICD-10-CM

## 2017-09-25 HISTORY — DX: Personal history of irradiation: Z92.3

## 2017-09-25 HISTORY — DX: Malignant neoplasm of unspecified site of unspecified female breast: C50.919

## 2017-10-02 DIAGNOSIS — Z86000 Personal history of in-situ neoplasm of breast: Secondary | ICD-10-CM | POA: Diagnosis not present

## 2017-10-02 DIAGNOSIS — Z923 Personal history of irradiation: Secondary | ICD-10-CM | POA: Diagnosis not present

## 2017-10-02 DIAGNOSIS — D0511 Intraductal carcinoma in situ of right breast: Secondary | ICD-10-CM | POA: Diagnosis not present

## 2017-10-02 DIAGNOSIS — Z79810 Long term (current) use of selective estrogen receptor modulators (SERMs): Secondary | ICD-10-CM | POA: Diagnosis not present

## 2017-11-11 DIAGNOSIS — Z23 Encounter for immunization: Secondary | ICD-10-CM | POA: Diagnosis not present

## 2017-11-12 MED FILL — LEVOTHYROXINE 100 MCG TABLE: 100 | 90 days supply | Qty: 90 | Fill #1

## 2017-11-12 MED FILL — HYDROCHLOROTHIAZIDE 25 MG T: 25 | 90 days supply | Qty: 90 | Fill #1

## 2017-12-11 DIAGNOSIS — Z853 Personal history of malignant neoplasm of breast: Secondary | ICD-10-CM | POA: Diagnosis not present

## 2017-12-11 DIAGNOSIS — K219 Gastro-esophageal reflux disease without esophagitis: Secondary | ICD-10-CM | POA: Diagnosis not present

## 2017-12-11 DIAGNOSIS — E559 Vitamin D deficiency, unspecified: Secondary | ICD-10-CM | POA: Diagnosis not present

## 2017-12-11 DIAGNOSIS — Z79899 Other long term (current) drug therapy: Secondary | ICD-10-CM | POA: Diagnosis not present

## 2017-12-11 DIAGNOSIS — H8103 Meniere's disease, bilateral: Secondary | ICD-10-CM | POA: Diagnosis not present

## 2017-12-11 DIAGNOSIS — E039 Hypothyroidism, unspecified: Secondary | ICD-10-CM | POA: Diagnosis not present

## 2017-12-11 DIAGNOSIS — J4520 Mild intermittent asthma, uncomplicated: Secondary | ICD-10-CM | POA: Diagnosis not present

## 2017-12-11 DIAGNOSIS — I10 Essential (primary) hypertension: Secondary | ICD-10-CM | POA: Diagnosis not present

## 2017-12-11 DIAGNOSIS — R7303 Prediabetes: Secondary | ICD-10-CM | POA: Diagnosis not present

## 2017-12-12 MED FILL — CLORAZEPATE 3.75 MG TABLET: 3.75 | 20 days supply | Qty: 60 | Fill #0

## 2017-12-20 MED FILL — RALOXIFENE HCL 60 MG TABS: 60 | 90 days supply | Qty: 90 | Fill #2

## 2018-02-04 DIAGNOSIS — E119 Type 2 diabetes mellitus without complications: Secondary | ICD-10-CM | POA: Diagnosis not present

## 2018-02-13 MED FILL — PANTOPRAZOLE SOD DR 40 MG T: 40 | 90 days supply | Qty: 90 | Fill #0

## 2018-02-13 MED FILL — ATORVASTATIN 10 MG TABLET: 10 | 90 days supply | Qty: 90 | Fill #1

## 2018-02-13 MED FILL — HYDROCHLOROTHIAZIDE 25 MG T: 25 | 90 days supply | Qty: 90 | Fill #0

## 2018-02-13 MED FILL — LEVOTHYROXINE 100 MCG TABLE: 100 | 90 days supply | Qty: 90 | Fill #0

## 2018-03-28 MED FILL — RALOXIFENE HCL 60 MG TABS: 60 | 90 days supply | Qty: 90 | Fill #3 | Status: TO

## 2018-04-02 DIAGNOSIS — Z86000 Personal history of in-situ neoplasm of breast: Secondary | ICD-10-CM | POA: Diagnosis not present

## 2018-04-02 DIAGNOSIS — Z85038 Personal history of other malignant neoplasm of large intestine: Secondary | ICD-10-CM | POA: Diagnosis not present

## 2018-04-02 DIAGNOSIS — Z79810 Long term (current) use of selective estrogen receptor modulators (SERMs): Secondary | ICD-10-CM | POA: Diagnosis not present

## 2018-05-12 MED FILL — HYDROCHLOROTHIAZIDE 25 MG T: 25 | 90 days supply | Qty: 90 | Fill #0

## 2018-05-12 MED FILL — LEVOTHYROXINE 100 MCG TAB: 100 | 90 days supply | Qty: 90 | Fill #0

## 2018-06-18 DIAGNOSIS — R5381 Other malaise: Secondary | ICD-10-CM | POA: Insufficient documentation

## 2018-06-18 DIAGNOSIS — R5383 Other fatigue: Secondary | ICD-10-CM

## 2018-06-18 DIAGNOSIS — E559 Vitamin D deficiency, unspecified: Secondary | ICD-10-CM | POA: Diagnosis not present

## 2018-06-18 DIAGNOSIS — J4520 Mild intermittent asthma, uncomplicated: Secondary | ICD-10-CM | POA: Diagnosis not present

## 2018-06-18 DIAGNOSIS — E039 Hypothyroidism, unspecified: Secondary | ICD-10-CM | POA: Diagnosis not present

## 2018-06-18 DIAGNOSIS — K219 Gastro-esophageal reflux disease without esophagitis: Secondary | ICD-10-CM | POA: Diagnosis not present

## 2018-06-18 DIAGNOSIS — I10 Essential (primary) hypertension: Secondary | ICD-10-CM | POA: Diagnosis not present

## 2018-06-18 DIAGNOSIS — Z79899 Other long term (current) drug therapy: Secondary | ICD-10-CM | POA: Diagnosis not present

## 2018-06-18 DIAGNOSIS — H8103 Meniere's disease, bilateral: Secondary | ICD-10-CM | POA: Diagnosis not present

## 2018-06-18 DIAGNOSIS — Z853 Personal history of malignant neoplasm of breast: Secondary | ICD-10-CM | POA: Diagnosis not present

## 2018-06-18 HISTORY — DX: Other fatigue: R53.83

## 2018-06-18 HISTORY — DX: Other malaise: R53.81

## 2018-06-20 MED FILL — RALOXIFENE HCL 60 MG TABS: 60 | 90 days supply | Qty: 90 | Fill #0

## 2018-06-25 DIAGNOSIS — R7303 Prediabetes: Secondary | ICD-10-CM | POA: Diagnosis not present

## 2018-06-25 DIAGNOSIS — R5383 Other fatigue: Secondary | ICD-10-CM | POA: Diagnosis not present

## 2018-06-25 DIAGNOSIS — R5381 Other malaise: Secondary | ICD-10-CM | POA: Diagnosis not present

## 2018-07-02 MED FILL — CYANOCOBALAMIN 1,000 MCG/ML: 1000 | 30 days supply | Qty: 1 | Fill #0

## 2018-08-01 MED FILL — CYANOCOBALAMIN 1,000 MCG/ML: 1000 | 30 days supply | Qty: 1 | Fill #1

## 2018-08-13 MED FILL — HYDROCHLOROTHIAZIDE 25 MG T: 25 | 90 days supply | Qty: 90 | Fill #0

## 2018-08-14 MED FILL — LEVOTHYROXINE 100 MCG TAB: 100 | 90 days supply | Qty: 90 | Fill #0

## 2018-08-18 DIAGNOSIS — M25561 Pain in right knee: Secondary | ICD-10-CM | POA: Diagnosis not present

## 2018-08-27 ENCOUNTER — Other Ambulatory Visit: Payer: Self-pay | Admitting: Oncology

## 2018-08-27 DIAGNOSIS — Z9889 Other specified postprocedural states: Secondary | ICD-10-CM

## 2018-09-07 MED FILL — CYANOCOBALAMIN 1,000 MCG/ML: 1000 | 30 days supply | Qty: 1 | Fill #2

## 2018-09-18 DIAGNOSIS — M25561 Pain in right knee: Secondary | ICD-10-CM | POA: Diagnosis not present

## 2018-09-18 DIAGNOSIS — G8929 Other chronic pain: Secondary | ICD-10-CM | POA: Insufficient documentation

## 2018-09-18 HISTORY — DX: Pain in right knee: M25.561

## 2018-09-25 MED FILL — RALOXIFENE HCL 60 MG TABS: 60 | 90 days supply | Qty: 90 | Fill #1

## 2018-09-26 MED FILL — PANTOPRAZOLE SOD DR 40 MG T: 40 | 90 days supply | Qty: 90 | Fill #1

## 2018-09-29 DIAGNOSIS — M25561 Pain in right knee: Secondary | ICD-10-CM | POA: Diagnosis not present

## 2018-09-30 ENCOUNTER — Ambulatory Visit
Admission: RE | Admit: 2018-09-30 | Discharge: 2018-09-30 | Disposition: A | Discharge status: Home / Self Care | Payer: 59 | Source: Ambulatory Visit | Attending: Oncology | Admitting: Oncology

## 2018-09-30 ENCOUNTER — Other Ambulatory Visit: Payer: Self-pay

## 2018-09-30 DIAGNOSIS — Z9889 Other specified postprocedural states: Secondary | ICD-10-CM

## 2018-09-30 DIAGNOSIS — R928 Other abnormal and inconclusive findings on diagnostic imaging of breast: Secondary | ICD-10-CM | POA: Diagnosis not present

## 2018-10-01 DIAGNOSIS — Z86000 Personal history of in-situ neoplasm of breast: Secondary | ICD-10-CM | POA: Diagnosis not present

## 2018-10-01 DIAGNOSIS — D0511 Intraductal carcinoma in situ of right breast: Secondary | ICD-10-CM | POA: Diagnosis not present

## 2018-10-01 DIAGNOSIS — Z79810 Long term (current) use of selective estrogen receptor modulators (SERMs): Secondary | ICD-10-CM | POA: Diagnosis not present

## 2018-10-01 DIAGNOSIS — M8589 Other specified disorders of bone density and structure, multiple sites: Secondary | ICD-10-CM | POA: Diagnosis not present

## 2018-10-10 DIAGNOSIS — M25561 Pain in right knee: Secondary | ICD-10-CM | POA: Diagnosis not present

## 2018-10-10 DIAGNOSIS — S83241A Other tear of medial meniscus, current injury, right knee, initial encounter: Secondary | ICD-10-CM | POA: Diagnosis not present

## 2018-10-16 MED FILL — CYANOCOBALAMIN 1,000 MCG/ML: 1000 | 30 days supply | Qty: 1 | Fill #3

## 2018-10-28 DIAGNOSIS — M23361 Other meniscus derangements, other lateral meniscus, right knee: Secondary | ICD-10-CM | POA: Diagnosis not present

## 2018-10-28 DIAGNOSIS — M958 Other specified acquired deformities of musculoskeletal system: Secondary | ICD-10-CM | POA: Diagnosis not present

## 2018-10-28 DIAGNOSIS — M23321 Other meniscus derangements, posterior horn of medial meniscus, right knee: Secondary | ICD-10-CM | POA: Diagnosis not present

## 2018-10-28 DIAGNOSIS — M2241 Chondromalacia patellae, right knee: Secondary | ICD-10-CM | POA: Diagnosis not present

## 2018-11-07 MED FILL — LEVOTHYROXINE 100 MCG TAB: 100 | 90 days supply | Qty: 90 | Fill #0

## 2018-11-07 MED FILL — HYDROCHLOROTHIAZIDE 25 MG T: 25 | 90 days supply | Qty: 90 | Fill #0

## 2018-11-17 MED FILL — CYANOCOBALAMIN 1,000 MCG/ML: 1000 | 30 days supply | Qty: 1 | Fill #4

## 2018-11-24 DIAGNOSIS — Z23 Encounter for immunization: Secondary | ICD-10-CM | POA: Diagnosis not present

## 2018-12-17 DIAGNOSIS — Z23 Encounter for immunization: Secondary | ICD-10-CM | POA: Diagnosis not present

## 2018-12-17 DIAGNOSIS — H8103 Meniere's disease, bilateral: Secondary | ICD-10-CM | POA: Diagnosis not present

## 2018-12-17 DIAGNOSIS — E538 Deficiency of other specified B group vitamins: Secondary | ICD-10-CM | POA: Insufficient documentation

## 2018-12-17 DIAGNOSIS — Z0000 Encounter for general adult medical examination without abnormal findings: Secondary | ICD-10-CM | POA: Diagnosis not present

## 2018-12-17 DIAGNOSIS — Z853 Personal history of malignant neoplasm of breast: Secondary | ICD-10-CM | POA: Diagnosis not present

## 2018-12-17 DIAGNOSIS — E039 Hypothyroidism, unspecified: Secondary | ICD-10-CM | POA: Diagnosis not present

## 2018-12-17 DIAGNOSIS — Z79899 Other long term (current) drug therapy: Secondary | ICD-10-CM | POA: Diagnosis not present

## 2018-12-17 DIAGNOSIS — R5383 Other fatigue: Secondary | ICD-10-CM | POA: Diagnosis not present

## 2018-12-17 DIAGNOSIS — R7303 Prediabetes: Secondary | ICD-10-CM | POA: Diagnosis not present

## 2018-12-17 DIAGNOSIS — R5381 Other malaise: Secondary | ICD-10-CM | POA: Diagnosis not present

## 2018-12-17 DIAGNOSIS — J4520 Mild intermittent asthma, uncomplicated: Secondary | ICD-10-CM | POA: Diagnosis not present

## 2018-12-17 DIAGNOSIS — I10 Essential (primary) hypertension: Secondary | ICD-10-CM | POA: Diagnosis not present

## 2018-12-17 DIAGNOSIS — E559 Vitamin D deficiency, unspecified: Secondary | ICD-10-CM | POA: Diagnosis not present

## 2018-12-17 DIAGNOSIS — K219 Gastro-esophageal reflux disease without esophagitis: Secondary | ICD-10-CM | POA: Diagnosis not present

## 2018-12-17 HISTORY — DX: Deficiency of other specified B group vitamins: E53.8

## 2018-12-17 MED FILL — CELECOXIB 100 MG CAPS: 100 | 90 days supply | Qty: 180 | Fill #0

## 2018-12-17 MED FILL — CLORAZEPATE 3.75 MG TABLET: 3.75 | 20 days supply | Qty: 60 | Fill #0

## 2018-12-17 MED FILL — BD LUER-LOK SYR 3 ML 25GX5/: 25G X 5/8" | 359 days supply | Qty: 12 | Fill #0

## 2018-12-17 MED FILL — ROSUVASTATIN CALCIUM 5 MG T: 5 | 90 days supply | Qty: 90 | Fill #0

## 2018-12-17 MED FILL — CYANOCOBALAMIN 1,000 MCG/ML: 1000 | 30 days supply | Qty: 1 | Fill #0

## 2018-12-17 MED FILL — PROMETHAZINE 25 MG TABLET: 25 | 22 days supply | Qty: 90 | Fill #0

## 2018-12-17 MED FILL — PANTOPRAZOLE SOD DR 40 MG T: 40 | 90 days supply | Qty: 90 | Fill #0

## 2018-12-29 MED FILL — FREESTYLE LITE TEST STRIP: 89 days supply | Qty: 100 | Fill #0

## 2018-12-29 MED FILL — FREESTYLE LITE METER: 90 days supply | Qty: 1 | Fill #0

## 2018-12-29 MED FILL — RALOXIFENE HCL 60 MG TABS: 60 | 90 days supply | Qty: 90 | Fill #2

## 2018-12-29 MED FILL — FREESTYLE LANCETS: 89 days supply | Qty: 100 | Fill #0

## 2019-01-07 DIAGNOSIS — Z20828 Contact with and (suspected) exposure to other viral communicable diseases: Secondary | ICD-10-CM | POA: Diagnosis not present

## 2019-01-13 DIAGNOSIS — J111 Influenza due to unidentified influenza virus with other respiratory manifestations: Secondary | ICD-10-CM | POA: Diagnosis not present

## 2019-01-13 DIAGNOSIS — R509 Fever, unspecified: Secondary | ICD-10-CM | POA: Diagnosis not present

## 2019-01-13 DIAGNOSIS — Z20828 Contact with and (suspected) exposure to other viral communicable diseases: Secondary | ICD-10-CM | POA: Diagnosis not present

## 2019-01-14 MED FILL — ALBUTEROL SULFATE HFA 108 (: 108 (90 BAS | 16 days supply | Qty: 18 | Fill #0

## 2019-02-09 MED FILL — HYDROCHLOROTHIAZIDE 25 MG T: 25 | 90 days supply | Qty: 90 | Fill #0

## 2019-02-09 MED FILL — LEVOTHYROXINE SODIUM 100 MC: 100 | 90 days supply | Qty: 90 | Fill #0

## 2019-03-14 MED FILL — CYANOCOBALAMIN 1,000 MCG/ML: 1000 | 30 days supply | Qty: 1 | Fill #1

## 2019-03-14 MED FILL — CELECOXIB 100 MG CAPS: 100 | 90 days supply | Qty: 180 | Fill #1

## 2019-03-14 MED FILL — PANTOPRAZOLE SOD DR 40 MG T: 40 | 90 days supply | Qty: 90 | Fill #1

## 2019-03-30 MED FILL — RALOXIFENE HCL 60 MG TABS: 60 | 90 days supply | Qty: 90 | Fill #3

## 2019-04-24 MED FILL — CYANOCOBALAMIN 1,000 MCG/ML: 1000 | 30 days supply | Qty: 1 | Fill #2

## 2019-05-11 DIAGNOSIS — R002 Palpitations: Secondary | ICD-10-CM | POA: Diagnosis not present

## 2019-05-11 DIAGNOSIS — E538 Deficiency of other specified B group vitamins: Secondary | ICD-10-CM | POA: Diagnosis not present

## 2019-05-11 DIAGNOSIS — E559 Vitamin D deficiency, unspecified: Secondary | ICD-10-CM | POA: Diagnosis not present

## 2019-05-11 DIAGNOSIS — E039 Hypothyroidism, unspecified: Secondary | ICD-10-CM | POA: Diagnosis not present

## 2019-05-11 DIAGNOSIS — R5381 Other malaise: Secondary | ICD-10-CM | POA: Diagnosis not present

## 2019-05-11 DIAGNOSIS — I10 Essential (primary) hypertension: Secondary | ICD-10-CM | POA: Diagnosis not present

## 2019-05-11 DIAGNOSIS — R5383 Other fatigue: Secondary | ICD-10-CM | POA: Diagnosis not present

## 2019-05-12 MED FILL — CIPROFLOXACIN HCL 500 MG TA: 500 | 5 days supply | Qty: 10 | Fill #0

## 2019-05-14 MED FILL — HYDROCHLOROTHIAZIDE 25 MG T: 25 | 90 days supply | Qty: 90 | Fill #1

## 2019-05-14 MED FILL — LEVOTHYROXINE SODIUM 100 MC: 100 | 90 days supply | Qty: 90 | Fill #1

## 2019-06-05 ENCOUNTER — Other Ambulatory Visit: Payer: Self-pay

## 2019-06-05 ENCOUNTER — Telehealth: Payer: Self-pay | Admitting: General Practice

## 2019-06-05 ENCOUNTER — Emergency Department (HOSPITAL_COMMUNITY): Payer: 59

## 2019-06-05 ENCOUNTER — Encounter (HOSPITAL_COMMUNITY): Payer: Self-pay | Admitting: Emergency Medicine

## 2019-06-05 ENCOUNTER — Emergency Department (HOSPITAL_COMMUNITY)
Admission: EM | Admit: 2019-06-05 | Discharge: 2019-06-05 | Disposition: A | Discharge status: Home / Self Care | Payer: 59 | Attending: Emergency Medicine | Admitting: Emergency Medicine

## 2019-06-05 DIAGNOSIS — Z853 Personal history of malignant neoplasm of breast: Secondary | ICD-10-CM | POA: Insufficient documentation

## 2019-06-05 DIAGNOSIS — I10 Essential (primary) hypertension: Secondary | ICD-10-CM | POA: Insufficient documentation

## 2019-06-05 DIAGNOSIS — R079 Chest pain, unspecified: Secondary | ICD-10-CM | POA: Insufficient documentation

## 2019-06-05 DIAGNOSIS — R002 Palpitations: Secondary | ICD-10-CM | POA: Insufficient documentation

## 2019-06-05 DIAGNOSIS — E039 Hypothyroidism, unspecified: Secondary | ICD-10-CM | POA: Diagnosis not present

## 2019-06-05 DIAGNOSIS — Z79899 Other long term (current) drug therapy: Secondary | ICD-10-CM | POA: Insufficient documentation

## 2019-06-05 DIAGNOSIS — R072 Precordial pain: Secondary | ICD-10-CM | POA: Diagnosis not present

## 2019-06-05 DIAGNOSIS — R0602 Shortness of breath: Secondary | ICD-10-CM | POA: Diagnosis not present

## 2019-06-05 LAB — CBC
HCT: 47.4 % — ABNORMAL HIGH (ref 36.0–46.0)
Hemoglobin: 15 g/dL (ref 12.0–15.0)
MCH: 27.7 pg (ref 26.0–34.0)
MCHC: 31.6 g/dL (ref 30.0–36.0)
MCV: 87.6 fL (ref 80.0–100.0)
Platelets: 272 10*3/uL (ref 150–400)
RBC: 5.41 MIL/uL — ABNORMAL HIGH (ref 3.87–5.11)
RDW: 13.3 % (ref 11.5–15.5)
WBC: 5.7 10*3/uL (ref 4.0–10.5)
nRBC: 0 % (ref 0.0–0.2)

## 2019-06-05 LAB — BASIC METABOLIC PANEL
Anion gap: 12 (ref 5–15)
BUN: 23 mg/dL (ref 8–23)
CO2: 27 mmol/L (ref 22–32)
Calcium: 9.6 mg/dL (ref 8.9–10.3)
Chloride: 100 mmol/L (ref 98–111)
Creatinine, Ser: 0.65 mg/dL (ref 0.44–1.00)
GFR calc Af Amer: >60 mL/min (ref >60)
GFR calc non Af Amer: >60 mL/min (ref >60)
Glucose, Bld: 100 mg/dL — ABNORMAL HIGH (ref 70–99)
Potassium: 3.6 mmol/L (ref 3.5–5.1)
Sodium: 139 mmol/L (ref 135–145)

## 2019-06-05 LAB — TROPONIN I (HIGH SENSITIVITY)
Troponin I (High Sensitivity): 5 ng/L (ref <18)
Troponin I (High Sensitivity): 3 ng/L (ref <18)

## 2019-06-05 MED ORDER — ASPIRIN 81 MG PO CHEW
324.0000 mg | CHEWABLE_TABLET | Freq: Once | ORAL | Status: AC
Start: 2019-06-05 — End: 2019-06-05
  Administered 2019-06-05: 19:00:00 324 mg via ORAL
  Filled 2019-06-05: qty 4

## 2019-06-05 NOTE — ED Notes (Signed)
PT verbalized understanding of d/c instructions, follow up care and s/s requiring return to ED. Pt had no further questions at this time. Pt transported via wheelchair to exit.

## 2019-06-05 NOTE — ED Provider Notes (Signed)
[facility] EMERGENCY DEPARTMENT Provider Note   CSN: [id number] Arrival date & time: 06/05/19  1218     History Chief Complaint  Patient presents with  . Chest Pain    [facility] is a 64 y.o. female.  He has had palpitations on and off for few months.  Her primary care doctor has been trying to get her into cardiology but they have been booked up.  She experience palpitations again today sometimes fast sometimes hard sometimes irregular but also was associated with some chest pressure.  This occurred at work.  She was not exerting herself.  Symptoms have improved and she just feels a little bit of chest tightness now.  No recent illness.  No cough fevers chills abdominal pain vomiting diarrhea.  The history is provided by the patient.  Chest Pain Pain location:  Substernal area Pain quality: pressure   Pain radiates to:  Does not radiate Pain severity:  Moderate Onset quality:  Gradual Timing:  Intermittent Progression:  Partially resolved Chronicity:  New Relieved by:  Nothing Worsened by:  Nothing Ineffective treatments:  None tried Associated symptoms: nausea and shortness of breath   Associated symptoms: no abdominal pain, no back pain, no cough, no diaphoresis, no dizziness, no fever, no headache and no vomiting   Risk factors: high cholesterol and hypertension   Risk factors: no smoking     HPI: A 64 year old patient with a history of hypertension and hypercholesterolemia presents for evaluation of chest pain. Initial onset of pain was approximately 3-6 hours ago. The patient's chest pain is described as heaviness/pressure/tightness and is not worse with exertion. The patient complains of nausea. The patient's chest pain is middle- or left-sided, is not well-localized, is not sharp and does radiate to the arms/jaw/neck. The patient denies diaphoresis. The patient has no history of stroke, has no history of peripheral artery disease, has not smoked  in the past 90 days, denies any history of treated diabetes, has no relevant family history of coronary artery disease (first degree relative at less than age 46) and does not have an elevated BMI (>=30).   Past Medical History:  Diagnosis Date  . Arthritis    knees  . Breast cancer ([place])   . GERD (gastroesophageal reflux disease)   . Hypertension   . Hypothyroidism   . Personal history of radiation therapy   . PONV (postoperative nausea and vomiting)   . Seasonal allergies     There are no problems to display for this patient.   Past Surgical History:  Procedure Laterality Date  . APPENDECTOMY    . BREAST BIOPSY    . BREAST EXCISIONAL BIOPSY    . BREAST LUMPECTOMY Right    2018  . BREAST LUMPECTOMY WITH RADIOACTIVE SEED LOCALIZATION Right 11/17/2014   Procedure: RIGHT RADIOACTIVE SEED LUMPECTOMY;  Surgeon: Donnie Mesa, MD;  Location: [facility];  Service: General;  Laterality: Right;  . BREAST SURGERY Left   . DILATION AND CURETTAGE OF UTERUS    . KNEE ARTHROSCOPY Left   . RE-EXCISION OF BREAST LUMPECTOMY Right 11/30/2014   Procedure: RE-EXCISION OF RIGHT BREAST LUMPECTOMY;  Surgeon: Donnie Mesa, MD;  Location: [facility];  Service: General;  Laterality: Right;     OB History   No obstetric history on file.     No family history on file.  Social History   Tobacco Use  . Smoking status: Never Smoker  Substance Use Topics  . Alcohol use:  No  . Drug use: No    Home Medications Prior to Admission medications   Medication Sig Start Date End Date Taking? Authorizing Provider  albuterol (PROVENTIL HFA;VENTOLIN HFA) 108 (90 BASE) MCG/ACT inhaler Inhale into the lungs every 6 (six) hours as needed for wheezing or shortness of breath.    [provider]  atorvastatin (LIPITOR) 10 MG tablet Take 10 mg by mouth daily.    [provider]  hydrochlorothiazide (HYDRODIURIL) 25 MG tablet Take 25 mg by mouth daily.    [provider]  HYDROcodone-acetaminophen (NORCO/VICODIN) 5-325 MG tablet Take 1 tablet by mouth every 4 (four) hours as needed. 11/17/14   Donnie Mesa, MD  levothyroxine (SYNTHROID, LEVOTHROID) 100 MCG tablet Take 100 mcg by mouth daily before breakfast.    [provider]  pantoprazole (PROTONIX) 40 MG tablet Take 40 mg by mouth daily.    [provider]    Allergies    Lodine [etodolac]  Review of Systems   Review of Systems  Constitutional: Negative for diaphoresis and fever.  HENT: Negative for sore throat.   Eyes: Negative for visual disturbance.  Respiratory: Positive for shortness of breath. Negative for cough.   Cardiovascular: Positive for chest pain.  Gastrointestinal: Positive for nausea. Negative for abdominal pain and vomiting.  Genitourinary: Negative for dysuria.  Musculoskeletal: Negative for back pain.  Skin: Negative for rash.  Neurological: Negative for dizziness and headaches.    Physical Exam Updated Vital Signs BP (!) 145/96 (BP Location: Right Arm)   Pulse 78   Temp 98.5 F (36.9 C) (Oral)   Resp 17   Ht 5\' 3"  (1.6 m)   Wt 78 kg   SpO2 100%   BMI 30.47 kg/m   Physical Exam Vitals and nursing note reviewed.  Constitutional:      General: She is not in acute distress.    Appearance: She is well-developed.  HENT:     Head: Normocephalic and atraumatic.  Eyes:     Conjunctiva/sclera: Conjunctivae normal.  Cardiovascular:     Rate and Rhythm: Normal rate and regular rhythm.     Heart sounds: Normal heart sounds. No murmur.  Pulmonary:     Effort: Pulmonary effort is normal. No respiratory distress.     Breath sounds: Normal breath sounds.  Abdominal:     Palpations: Abdomen is soft.     Tenderness: There is no abdominal tenderness.  Musculoskeletal:        General: Normal range of motion.     Cervical back: Neck supple.     Right lower leg: No tenderness.     Left lower leg: No tenderness.  Skin:    General: Skin is  warm and dry.     Capillary Refill: Capillary refill takes less than 2 seconds.  Neurological:     General: No focal deficit present.     Mental Status: She is alert.     ED Results / Procedures / Treatments   Labs (all labs ordered are listed, but only abnormal results are displayed) Labs Reviewed  BASIC METABOLIC PANEL - Abnormal; Notable for the following components:      Result Value   Glucose, Bld 100 (*)    All other components within normal limits  CBC - Abnormal; Notable for the following components:   RBC 5.41 (*)    HCT 47.4 (*)    All other components within normal limits  TROPONIN I (HIGH SENSITIVITY)  TROPONIN I (HIGH SENSITIVITY)  EKG EKG Interpretation  Date/Time:  Friday June 05 2019 12:27:40 EDT Ventricular Rate:  88 PR Interval:  184 QRS Duration: 102 QT Interval:  406 QTC Calculation: 491 R Axis:   3 Text Interpretation: Normal sinus rhythm Prolonged QT Abnormal ECG No old tracing to compare Confirmed by Aletta Edouard [phone]) on 06/05/2019 5:42:04 PM   Radiology DG Chest 2 View  Result Date: 06/05/2019 CLINICAL DATA:  Chest pressure and shortness of breath. Palpitations. EXAM: CHEST - 2 VIEW COMPARISON:  01/17/2010 FINDINGS: Normal sized heart. Clear lungs with normal vascularity. Small right anterior chest surgical clips. Thoracic spine degenerative changes and mild scoliosis. IMPRESSION: No acute abnormality. Electronically Signed   By: Claudie Revering M.D.   On: 06/05/2019 13:17    Procedures Procedures (including critical care time)  Medications Ordered in ED Medications  aspirin chewable tablet 324 mg (324 mg Oral Given 06/05/19 1836)    ED Course  I have reviewed the triage vital signs and the nursing notes.  Pertinent labs & imaging results that were available during my care of the patient were reviewed by me and considered in my medical decision making (see chart for details).  Clinical Course as of Jun 05 2223  Fri Jun 05, 2019    1925 Patient's delta troponin unremarkable.  Her work-up is essentially been normal.  Her symptoms have resolved since she has been here.  We talked about following up with her doctor and she also has not heard anything about her referral to [facility] cardiology so I will try to place this for her from here.  We discussed return instructions   [MB]    Clinical Course User Index [MB] Hayden Rasmussen, MD   MDM Rules/Calculators/A&P HEAR Score: 5                    This patient complains of chest pain and palpitations; this involves an extensive number of treatment Options and is a complaint that carries with it a high risk of complications and Morbidity. The differential includes arrhythmia, ACS, anxiety, reflux, metabolic derangement, pneumonia, PE  I ordered, reviewed and interpreted labs, which included normal CBC normal chemistries first troponin V.  Delta troponin pending. I ordered medication aspirin I ordered imaging studies which included chest x-ray and I independently    visualized and interpreted imaging which showed no pneumonia or pneumothorax. Previous records obtained and reviewed in epic  After the interventions stated above, I reevaluated the patient and found patient's pain is much improved.  We discussed the limitations of the work-up we have done so far and the need to follow-up with her doctor.  I have also placed a referral for her to cardiology.  Care instructions discussed  Final Clinical Impression(s) / ED Diagnoses Final diagnoses:  Nonspecific chest pain  Palpitations    Rx / DC Orders ED Discharge Orders    None       Hayden Rasmussen, MD 06/05/19 2232

## 2019-06-05 NOTE — Telephone Encounter (Signed)
Patient states her PCP sent a referral for her. She is calling to see if it has been received and schedule an appointment. She states it is okay to leave a message.

## 2019-06-05 NOTE — ED Triage Notes (Signed)
Patient states she has been seen by her PCP for palpitations since late March and is waiting to get into cardiologist office. Starting today left sided chest pressure with shortness of breath. States she had difficulty trying to finish a sentence earlier.

## 2019-06-05 NOTE — Discharge Instructions (Addendum)
You were evaluated in the emergency department for chest pain and palpitations. Chest x-ray EKG and blood work that did not show any evidence of a heart attack.  We are trying to get you into cardiology for further evaluation of your symptoms.  Will also be important for you to follow-up with your primary care doctor.  If your symptoms worsen in any way or any other concerning symptoms please return to the emergency department

## 2019-06-05 NOTE — Telephone Encounter (Signed)
This needs to be addressed by scheduling. Nursing does not have access to see referrals.

## 2019-06-08 ENCOUNTER — Ambulatory Visit: Payer: 59 | Admitting: Cardiology

## 2019-06-08 ENCOUNTER — Other Ambulatory Visit: Payer: Self-pay

## 2019-06-08 ENCOUNTER — Encounter: Payer: Self-pay | Admitting: *Deleted

## 2019-06-08 ENCOUNTER — Ambulatory Visit: Payer: 59

## 2019-06-08 ENCOUNTER — Encounter: Payer: Self-pay | Admitting: Family Medicine

## 2019-06-08 VITALS — BP 130/90 | HR 84 | Ht 63.0 in | Wt 176.0 lb

## 2019-06-08 DIAGNOSIS — R002 Palpitations: Secondary | ICD-10-CM

## 2019-06-08 DIAGNOSIS — I10 Essential (primary) hypertension: Secondary | ICD-10-CM

## 2019-06-08 DIAGNOSIS — R072 Precordial pain: Secondary | ICD-10-CM | POA: Diagnosis not present

## 2019-06-08 DIAGNOSIS — E782 Mixed hyperlipidemia: Secondary | ICD-10-CM | POA: Diagnosis not present

## 2019-06-08 DIAGNOSIS — R0602 Shortness of breath: Secondary | ICD-10-CM | POA: Diagnosis not present

## 2019-06-08 MED ORDER — METOPROLOL TARTRATE 100 MG PO TABS: 100.0000 mg | ORAL_TABLET | ORAL | 0 refills | Status: DC

## 2019-06-08 MED FILL — METOPROLOL TARTRATE 100 MG: 100 | 1 days supply | Qty: 1 | Fill #0

## 2019-06-08 NOTE — Patient Instructions (Addendum)
Medication Instructions:  Your physician recommends that you continue on your current medications as directed. Please refer to the Current Medication list given to you today.  *If you need a refill on your cardiac medications before your next appointment, please call your pharmacy*   Lab Work: Your physician recommends that you return for lab work 1 week prior to your CTA   If you have labs (blood work) drawn today and your tests are completely normal, you will receive your results only by: Marland Kitchen MyChart Message (if you have MyChart) OR . A paper copy in the mail If you have any lab test that is abnormal or we need to change your treatment, we will call you to review the results.   Testing/Procedures: Your physician has requested that you have an echocardiogram. Echocardiography is a painless test that uses sound waves to create images of your heart. It provides your doctor with information about the size and shape of your heart and how well your heart's chambers and valves are working. This procedure takes approximately one hour. There are no restrictions for this procedure.  A zio monitor was ordered today. It will remain on for 7 days. You will then return monitor and event diary in provided box. It takes 1-2 weeks for report to be downloaded and returned to [place]. We will call you with the results. If monitor falls off or has orange flashing light, please call Zio for further instructions.   Your physician has ordered for you to have a Cardiac CT *Instructions below*    Follow-Up: At [facility], you and your health needs are our priority.  As part of our continuing mission to provide you with exceptional heart care, we have created designated Provider Care Teams.  These Care Teams include your primary Cardiologist (physician) and Advanced Practice Providers (APPs -  Physician Assistants and Nurse Practitioners) who all work together to provide you with the care you need, when you need  it.  We recommend signing up for the patient portal called "MyChart".  Sign up information is provided on this After Visit Summary.  MyChart is used to connect with patients for Virtual Visits (Telemedicine).  Patients are able to view lab/test results, encounter notes, upcoming appointments, etc.  Non-urgent messages can be sent to your provider as well.   To learn more about what you can do with MyChart, go to [email or web address].    Your next appointment:   3 month(s)  The format for your next appointment:   In Person  Provider:   Berniece Salines, DO   Other Instructions Your cardiac CT will be scheduled at one of the below locations:   [facility] [address] [place], [place] [address] [phone]  If scheduled at [facility], please arrive at the [facility] main entrance of [facility] 30 minutes prior to test start time. Proceed to the [facility] radiology Department (first floor) to check-in and test prep.   Please follow these instructions carefully (unless otherwise directed):  On the Night Before the Test: . Be sure to Drink plenty of water. . Do not consume any caffeinated/decaffeinated beverages or chocolate 12 hours prior to your test. . Do not take any antihistamines 12 hours prior to your test.  On the Day of the Test: . Drink plenty of water. Do not drink any water within one hour of the test. . Do not eat any food 4 hours prior to the test. . You may take  your regular medications prior to the test.  . Take metoprolol (Lopressor) two hours prior to test. . HOLD Hydrochlorothiazide morning of the test. . FEMALES- please wear underwire-free bra if available         After the Test: . Drink plenty of water. . After receiving IV contrast, you may experience a mild flushed feeling. This is normal. . On occasion, you may experience a mild rash up to 24 hours after the test. This is not dangerous. If this occurs, you can take  Benadryl 25 mg and increase your fluid intake. . If you experience trouble breathing, this can be serious. If it is severe call 911 IMMEDIATELY. If it is mild, please call our office.  Once we have confirmed authorization from your insurance company, we will call you to set up a date and time for your test.   For non-scheduling related questions, please contact the cardiac imaging nurse navigator should you have any questions/concerns: Marchia Bond, RN Navigator Cardiac Imaging [facility] [phone] office  For scheduling needs, including cancellations and rescheduling, please call [phone].

## 2019-06-08 NOTE — Progress Notes (Signed)
Cardiology Office Note:    Date:  06/08/2019   ID:  Amanda Haas, DOB May 02, 1955, MRN [id number]  PCP:  Mayer Camel, NP  Cardiologist:  Berniece Salines, DO  Electrophysiologist:  None   Referring MD: Hayden Rasmussen, MD   The patient was referred by her pcp.  History of Present Illness:    Amanda Haas is a 64 y.o. 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    The patient tells me that on Friday at work she started experience left-sided chest pressure.  She notes that it lasted for about 15 minutes.  She states there was associated shortness of breath.  In the meantime she felt nauseous however this resolved.  Over the last several months her problem has been increasing palpitations.  She notes that back in March palpitations started and he has been getting increasingly worse.  No other complaints at this time.  The patient was seen at the [facility] emergency department on June 05, 2019, at that time her high-sensitivity troponin was normal.  Past Medical History:  Diagnosis Date  . Arthritis    knees  . Breast cancer ([place])   . Encounter for long-term (current) use of high-risk medication 10/19/2015  . Essential hypertension 10/19/2015  . Gastroesophageal reflux disease 10/19/2015  . GERD (gastroesophageal reflux disease)   . History of breast cancer 10/19/2015   Formatting of this note might be different from the original. Ductal carcinoma in situ continues to follow with oncology  Last Assessment & Plan:  Formatting of  this note might be different from the original. Follows with Dr. Hinton Rao, diagnosed in 2016 started her tamoxifen and 2017 for a total of 5 years  . Hypertension   . Hypothyroidism   . Hypothyroidism (acquired) 10/19/2015  . Malaise and fatigue 06/18/2018  . Meniere's disease 10/19/2015  . Mild intermittent asthma 10/19/2015  . Pain in right knee 09/18/2018  . Personal history of radiation therapy   . PONV (postoperative nausea and vomiting)   . Seasonal allergies   . Vitamin B12 deficiency 12/17/2018  . Vitamin D deficiency 10/19/2015    Past Surgical History:  Procedure Laterality Date  . APPENDECTOMY    . BREAST BIOPSY    . BREAST EXCISIONAL BIOPSY    . BREAST LUMPECTOMY Right    2018  . BREAST LUMPECTOMY WITH RADIOACTIVE SEED LOCALIZATION Right 11/17/2014   Procedure: RIGHT RADIOACTIVE SEED LUMPECTOMY;  Surgeon: Donnie Mesa, MD;  Location: [place];  Service: General;  Laterality: Right;  . BREAST SURGERY Left   . DILATION AND CURETTAGE OF UTERUS    . KNEE ARTHROSCOPY Left   . RE-EXCISION OF BREAST LUMPECTOMY Right 11/30/2014   Procedure: RE-EXCISION OF RIGHT BREAST LUMPECTOMY;  Surgeon: Donnie Mesa, MD;  Location: [place];  Service: General;  Laterality: Right;    Current Medications: Current Meds  Medication Sig  . albuterol (PROVENTIL HFA;VENTOLIN HFA) 108 (90 BASE) MCG/ACT inhaler Inhale 1-2 puffs into the lungs every 6 (six) hours as needed for wheezing or shortness of breath.   . celecoxib (CELEBREX) 100 MG capsule Take  200 mg by mouth in the morning.  . Cholecalciferol (VITAMIN D3) 125 MCG (5000 UT) CAPS Take 5,000 Units by mouth daily.  . clorazepate (TRANXENE) 3.75 MG tablet Take 3.75 mg by mouth daily as needed (for symptoms of Mnire's disease).   . Cyanocobalamin (VITAMIN B-12 PO) Take 1 tablet by mouth daily.  . Cyanocobalamin 1000 MCG/ML KIT Inject 1,000 mcg into the skin every 30 (thirty) days.   . hydrochlorothiazide  (HYDRODIURIL) 25 MG tablet Take 25 mg by mouth daily.  Marland Kitchen levothyroxine (SYNTHROID, LEVOTHROID) 100 MCG tablet Take 100 mcg by mouth daily before breakfast.  . pantoprazole (PROTONIX) 40 MG tablet Take 40 mg by mouth daily before breakfast.   . promethazine (PHENERGAN) 25 MG tablet Take 25 mg by mouth every 6 (six) hours as needed for nausea or vomiting.  . raloxifene (EVISTA) 60 MG tablet Take 60 mg by mouth every evening.  . rosuvastatin (CRESTOR) 5 MG tablet Take 5 mg by mouth See admin instructions. Take 5 mg by mouth once a day on Tues/Thurs/Sat     Allergies:   Etodolac   Social History   Socioeconomic History  . Marital status: Married    Spouse name: Not on file  . Number of children: Not on file  . Years of education: Not on file  . Highest education level: Not on file  Occupational History  . Not on file  Tobacco Use  . Smoking status: Never Smoker  . Smokeless tobacco: Never Used  Substance and Sexual Activity  . Alcohol use: No  . Drug use: No  . Sexual activity: Not on file  Other Topics Concern  . Not on file  Social History Narrative  . Not on file   Social Determinants of Health   Financial Resource Strain:   . Difficulty of Paying Living Expenses:   Food Insecurity:   . Worried About Charity fundraiser in the Last Year:   . Arboriculturist in the Last Year:   Transportation Needs:   . Film/video editor (Medical):   Marland Kitchen Lack of Transportation (Non-Medical):   Physical Activity:   . Days of Exercise per Week:   . Minutes of Exercise per Session:   Stress:   . Feeling of Stress :   Social Connections:   . Frequency of Communication with Friends and Family:   . Frequency of Social Gatherings with Friends and Family:   . Attends Religious Services:   . Active Member of Clubs or Organizations:   . Attends Archivist Meetings:   Marland Kitchen Marital Status:      Family History: The patient's family history includes Atrial fibrillation in her  father; Colon cancer in her father; Diabetes in her maternal grandfather; Heart disease in her father and paternal grandfather; Hypertension in her sister; Hyperthyroidism in her brother; Pancreatic cancer in her mother; Stroke in her maternal grandmother and paternal grandmother.  ROS:   Review of Systems  Constitution: Negative for decreased appetite, fever and weight gain.  HENT: Negative for congestion, ear discharge, hoarse voice and sore throat.   Eyes: Negative for discharge, redness, vision loss in right eye and visual halos.  Cardiovascular: Negative for chest pain, dyspnea on exertion, leg swelling, orthopnea and palpitations.  Respiratory: Negative for cough, hemoptysis, shortness of breath and snoring.   Endocrine: Negative for heat intolerance and polyphagia.  Hematologic/Lymphatic: Negative for bleeding problem. Does not bruise/bleed easily.  Skin: Negative for flushing, nail changes, rash and suspicious lesions.  Musculoskeletal: Negative for arthritis, joint pain, muscle cramps, myalgias, neck pain and stiffness.  Gastrointestinal: Negative for abdominal pain, bowel incontinence, diarrhea and excessive appetite.  Genitourinary: Negative for decreased libido, genital sores and incomplete emptying.  Neurological: Negative for brief paralysis, focal weakness, headaches and loss of balance.  Psychiatric/Behavioral: Negative for altered mental status, depression and suicidal ideas.  Allergic/Immunologic: Negative for HIV exposure and persistent infections.    EKGs/Labs/Other Studies Reviewed:    The following studies were reviewed today:   EKG:  The ekg ordered today demonstrates sinus rhythm, heart rate 80 bpm, nonspecific interventricular conduction defect with prolonged QTC compared to EKG done on June 05, 2019 which shows sinus rhythm, nonspecific interventricular conduction defect with prolonged QTC.  Therefore no significant change.  Chest x-ray done in June 05, 2019  FINDINGS: Normal sized heart. Clear lungs with normal vascularity. Small right anterior chest surgical clips. Thoracic spine degenerative changes and mild scoliosis. IMPRESSION: No acute abnormality.   Recent Labs: 06/05/2019: BUN 23; Creatinine, Ser 0.65; Hemoglobin 15.0; Platelets 272; Potassium 3.6; Sodium 139  Recent Lipid Panel No results found for: CHOL, TRIG, HDL, CHOLHDL, VLDL, LDLCALC, LDLDIRECT  Physical Exam:    VS:  BP 130/90 (BP Location: Right Arm, Patient Position: Sitting, Cuff Size: Normal)   Pulse 84   Ht '5\' 3"'$  (1.6 m)   Wt 176 lb (79.8 kg)   SpO2 98%   BMI 31.18 kg/m     Wt Readings from Last 3 Encounters:  06/08/19 176 lb (79.8 kg)  06/05/19 172 lb (78 kg)  11/30/14 179 lb 2 oz (81.3 kg)     GEN: Well nourished, well developed in no acute distress HEENT: Normal NECK: No JVD; No carotid bruits LYMPHATICS: No lymphadenopathy CARDIAC: S1S2 noted,RRR, no murmurs, rubs, gallops RESPIRATORY:  Clear to auscultation without rales, wheezing or rhonchi  ABDOMEN: Soft, non-tender, non-distended, +bowel sounds, no guarding. EXTREMITIES: No edema, No cyanosis, no clubbing MUSCULOSKELETAL:  No deformity  SKIN: Warm and dry NEUROLOGIC:  Alert and oriented x 3, non-focal PSYCHIATRIC:  Normal affect, good insight  ASSESSMENT:    1. Precordial pain   2. Shortness of breath   3. Palpitations   4. Essential hypertension   5. Mixed hyperlipidemia    PLAN:     Her chest pain is concerning given her intermediate risk factors.  At the time I like to pursue an ischemic evaluation.  An appropriate test in this patient will be a coronary CTA.  She has no IV contrast dye allergies.  Of educated patient about this test and she is willing to proceed with this testing.  Short of breath-with a history of radiation I like to get an echocardiogram to rule out any radiation valvular heart disease.  Palpitations-we will get a 7-day ZIO monitor to assess for any cardiac  arrhythmia.  She is on raloxifene which mostly causes heart flashes, not known for any cardiac arrhythmias or coronary artery disease.  We will continue to monitor.  Hypertension-blood pressure acceptable in the office today we will consider her current medication regimen. Hyperlipidemia continue patient on her Crestor.  Recent blood work done by PCP in the emergency department was also reviewed.   The patient is in agreement with the above plan. The patient left the office in stable condition.  The patient will follow up in 3 months or sooner I needed.   Medication Adjustments/Labs and Tests Ordered: Current medicines are reviewed at length with the patient today.  Concerns regarding medicines are  outlined above.  Orders Placed This Encounter  Procedures  . CT CORONARY MORPH W/CTA COR W/SCORE W/CA W/CM &/OR WO/CM  . CT CORONARY FRACTIONAL FLOW RESERVE DATA PREP  . CT CORONARY FRACTIONAL FLOW RESERVE FLUID ANALYSIS  . LONG TERM MONITOR (3-14 DAYS)  . ECHOCARDIOGRAM COMPLETE   Meds ordered this encounter  Medications  . metoprolol tartrate (LOPRESSOR) 100 MG tablet    Sig: Take 1 tablet (100 mg total) by mouth as directed. Take 2 hours prior to your Cardiac CT    Dispense:  1 tablet    Refill:  0    Patient Instructions  Medication Instructions:  Your physician recommends that you continue on your current medications as directed. Please refer to the Current Medication list given to you today.  *If you need a refill on your cardiac medications before your next appointment, please call your pharmacy*   Lab Work: None ordered   If you have labs (blood work) drawn today and your tests are completely normal, you will receive your results only by: Marland Kitchen MyChart Message (if you have MyChart) OR . A paper copy in the mail If you have any lab test that is abnormal or we need to change your treatment, we will call you to review the results.   Testing/Procedures: Your physician has  requested that you have an echocardiogram. Echocardiography is a painless test that uses sound waves to create images of your heart. It provides your doctor with information about the size and shape of your heart and how well your heart's chambers and valves are working. This procedure takes approximately one hour. There are no restrictions for this procedure.  A zio monitor was ordered today. It will remain on for 7 days. You will then return monitor and event diary in provided box. It takes 1-2 weeks for report to be downloaded and returned to [place]. We will call you with the results. If monitor falls off or has orange flashing light, please call Zio for further instructions.   Your physician has ordered for you to have a Cardiac CT *Instructions below*    Follow-Up: At [facility], you and your health needs are our priority.  As part of our continuing mission to provide you with exceptional heart care, we have created designated Provider Care Teams.  These Care Teams include your primary Cardiologist (physician) and Advanced Practice Providers (APPs -  Physician Assistants and Nurse Practitioners) who all work together to provide you with the care you need, when you need it.  We recommend signing up for the patient portal called "MyChart".  Sign up information is provided on this After Visit Summary.  MyChart is used to connect with patients for Virtual Visits (Telemedicine).  Patients are able to view lab/test results, encounter notes, upcoming appointments, etc.  Non-urgent messages can be sent to your provider as well.   To learn more about what you can do with MyChart, go to [email or web address].    Your next appointment:   3 month(s)  The format for your next appointment:   In Person  Provider:   Berniece Salines, DO   Other Instructions Your cardiac CT will be scheduled at one of the below locations:   [facility] [address] [place], [place] [address] [phone]  If scheduled at [facility], please arrive at the [facility] main entrance of [facility] 30 minutes prior to test start time. Proceed to the [facility] radiology Department (first floor) to  check-in and test prep.   Please follow these instructions carefully (unless otherwise directed):  On the Night Before the Test: . Be sure to Drink plenty of water. . Do not consume any caffeinated/decaffeinated beverages or chocolate 12 hours prior to your test. . Do not take any antihistamines 12 hours prior to your test.  On the Day of the Test: . Drink plenty of water. Do not drink any water within one hour of the test. . Do not eat any food 4 hours prior to the test. . You may take your regular medications prior to the test.  . Take metoprolol (Lopressor) two hours prior to test. . HOLD Hydrochlorothiazide morning of the test. . FEMALES- please wear underwire-free bra if available         After the Test: . Drink plenty of water. . After receiving IV contrast, you may experience a mild flushed feeling. This is normal. . On occasion, you may experience a mild rash up to 24 hours after the test. This is not dangerous. If this occurs, you can take Benadryl 25 mg and increase your fluid intake. . If you experience trouble breathing, this can be serious. If it is severe call 911 IMMEDIATELY. If it is mild, please call our office.  Once we have confirmed authorization from your insurance company, we will call you to set up a date and time for your test.   For non-scheduling related questions, please contact the cardiac imaging nurse navigator should you have any questions/concerns: Marchia Bond, RN Navigator Cardiac Imaging [facility] [phone] office  For scheduling needs, including cancellations and rescheduling, please call [phone].        Adopting a Healthy Lifestyle.  Know what a healthy weight is for you (roughly BMI  <25) and aim to maintain this   Aim for 7+ servings of fruits and vegetables daily   65-80+ fluid ounces of water or unsweet tea for healthy kidneys   Limit to max 1 drink of alcohol per day; avoid smoking/tobacco   Limit animal fats in diet for cholesterol and heart health - choose grass fed whenever available   Avoid highly processed foods, and foods high in saturated/trans fats   Aim for low stress - take time to unwind and care for your mental health   Aim for 150 min of moderate intensity exercise weekly for heart health, and weights twice weekly for bone health   Aim for 7-9 hours of sleep daily   When it comes to diets, agreement about the perfect plan isnt easy to find, even among the experts. Experts at the [employer] developed an idea known as the Healthy Eating Plate. Just imagine a plate divided into logical, healthy portions.   The emphasis is on diet quality:   Load up on vegetables and fruits - one-half of your plate: Aim for color and variety, and remember that potatoes dont count.   Go for whole grains - one-quarter of your plate: Whole wheat, barley, wheat berries, quinoa, oats, brown rice, and foods made with them. If you want pasta, go with whole wheat pasta.   Protein power - one-quarter of your plate: Fish, chicken, beans, and nuts are all healthy, versatile protein sources. Limit red meat.   The diet, however, does go beyond the plate, offering a few other suggestions.   Use healthy plant oils, such as olive, canola, soy, corn, sunflower and peanut. Check the labels, and avoid partially hydrogenated oil,  which have unhealthy trans fats.   If youre thirsty, drink water. Coffee and tea are good in moderation, but skip sugary drinks and limit milk and dairy products to one or two daily servings.   The type of carbohydrate in the diet is more important than the amount. Some sources of carbohydrates, such as vegetables, fruits, whole  grains, and beans-are healthier than others.   Finally, stay active  Signed, Berniece Salines, DO  06/08/2019 10:00 AM    [place]

## 2019-06-09 MED FILL — PANTOPRAZOLE SOD DR 40 MG T: 40 | 90 days supply | Qty: 90 | Fill #0

## 2019-06-09 MED FILL — CELECOXIB 100 MG CAPS: 100 | 90 days supply | Qty: 180 | Fill #2

## 2019-06-09 NOTE — Addendum Note (Signed)
Addended by: Jerl Santos R on: 06/09/2019 10:46 AM   Modules accepted: Orders

## 2019-06-10 MED FILL — CYANOCOBALAMIN 1,000 MCG/ML: 1000 | 30 days supply | Qty: 1 | Fill #5

## 2019-06-12 ENCOUNTER — Other Ambulatory Visit: Payer: Self-pay

## 2019-06-12 ENCOUNTER — Ambulatory Visit (INDEPENDENT_AMBULATORY_CARE_PROVIDER_SITE_OTHER): Payer: 59

## 2019-06-12 DIAGNOSIS — R0602 Shortness of breath: Secondary | ICD-10-CM

## 2019-06-12 NOTE — Progress Notes (Signed)
Complete echocardiogram has been performed.  Jimmy Velva Molinari RDCS, RVT 

## 2019-07-01 ENCOUNTER — Telehealth (HOSPITAL_COMMUNITY): Payer: Self-pay | Admitting: *Deleted

## 2019-07-01 NOTE — Telephone Encounter (Signed)

## 2019-07-02 ENCOUNTER — Encounter (HOSPITAL_COMMUNITY): Payer: Self-pay

## 2019-07-02 ENCOUNTER — Other Ambulatory Visit: Payer: Self-pay

## 2019-07-02 ENCOUNTER — Ambulatory Visit (HOSPITAL_COMMUNITY)
Admission: RE | Admit: 2019-07-02 | Discharge: 2019-07-02 | Disposition: A | Discharge status: Home / Self Care | Payer: 59 | Source: Ambulatory Visit | Attending: Cardiology | Admitting: Cardiology

## 2019-07-02 DIAGNOSIS — R072 Precordial pain: Secondary | ICD-10-CM | POA: Diagnosis not present

## 2019-07-02 MED ORDER — NITROGLYCERIN 0.4 MG SL SUBL
SUBLINGUAL_TABLET | SUBLINGUAL | Status: AC
  Filled 2019-07-02: qty 2

## 2019-07-02 MED ORDER — METOPROLOL TARTRATE 5 MG/5ML IV SOLN: 5.0000 mg | INTRAVENOUS | Status: DC | PRN

## 2019-07-02 MED ORDER — NITROGLYCERIN 0.4 MG SL SUBL
0.8000 mg | SUBLINGUAL_TABLET | Freq: Once | SUBLINGUAL | Status: AC
  Administered 2019-07-02: 0.8 mg via SUBLINGUAL

## 2019-07-02 MED ORDER — METOPROLOL TARTRATE 5 MG/5ML IV SOLN
INTRAVENOUS | Status: AC
  Administered 2019-07-02: 5 mg via INTRAVENOUS
  Filled 2019-07-02: qty 10

## 2019-07-02 MED ORDER — IOHEXOL 350 MG/ML SOLN
80.0000 mL | Freq: Once | INTRAVENOUS | Status: AC | PRN
  Administered 2019-07-02: 80 mL via INTRAVENOUS

## 2019-07-03 ENCOUNTER — Telehealth: Payer: Self-pay

## 2019-07-03 NOTE — Telephone Encounter (Signed)
Left message on patients voicemail to please return our call.   

## 2019-07-03 NOTE — Telephone Encounter (Signed)
-----   Message from Berniece Salines, DO sent at 07/03/2019  8:38 AM EDT ----- Your coronary CTA showed mild coronary artery disease.  Already on Crestor.  I will like to start aspirin 81 mg daily.

## 2019-07-06 ENCOUNTER — Telehealth: Payer: Self-pay

## 2019-07-06 ENCOUNTER — Other Ambulatory Visit (HOSPITAL_COMMUNITY): Payer: Self-pay | Admitting: Oncology

## 2019-07-06 MED FILL — RALOXIFENE HCL 60 MG TABS: 60 | 90 days supply | Qty: 90 | Fill #0

## 2019-07-06 MED FILL — CYANOCOBALAMIN 1,000 MCG/ML: 1000 | 30 days supply | Qty: 1 | Fill #0

## 2019-07-06 NOTE — Telephone Encounter (Signed)
Left message on patients voicemail to please return our call.   

## 2019-07-06 NOTE — Telephone Encounter (Signed)
-----   Message from Berniece Salines, DO sent at 07/03/2019  7:40 PM EDT ----- Your monitor was normal. Overall your triggered events were associated with sinus rhythm.

## 2019-07-07 NOTE — Telephone Encounter (Signed)
Left message on patients voicemail to please return our call.   

## 2019-07-07 NOTE — Telephone Encounter (Signed)
Spoke with patient regarding results and recommendation. Patient states that she will begin taking an OTC aspirin 81 mg daily.   Patient verbalizes understanding and is agreeable to plan of care. Advised patient to call back with any issues or concerns.

## 2019-07-07 NOTE — Telephone Encounter (Signed)
New Message ° ° ° °Pt is returning call  °

## 2019-07-07 NOTE — Telephone Encounter (Signed)
Spoke with patient regarding results and recommendation.  Patient verbalizes understanding and is agreeable to plan of care. Advised patient to call back with any issues or concerns.  

## 2019-08-06 ENCOUNTER — Other Ambulatory Visit: Payer: Self-pay

## 2019-08-06 ENCOUNTER — Encounter: Payer: Self-pay | Admitting: Family Medicine

## 2019-08-06 ENCOUNTER — Ambulatory Visit: Payer: 59 | Admitting: Family Medicine

## 2019-08-06 VITALS — BP 110/78 | HR 105 | Temp 97.5°F | Wt 177.0 lb

## 2019-08-06 DIAGNOSIS — M25561 Pain in right knee: Secondary | ICD-10-CM | POA: Diagnosis not present

## 2019-08-06 DIAGNOSIS — G8929 Other chronic pain: Secondary | ICD-10-CM | POA: Diagnosis not present

## 2019-08-06 NOTE — Progress Notes (Signed)
Acute Office Visit  Subjective:    Patient ID: Amanda Haas, female    DOB: 22-Dec-1955, 64 y.o.   MRN: [id number]  Chief Complaint  Patient presents with  . Right knee pain    HPI Patient is in today for right knee pain. She had arthroscopic surgery of her right knee in the fall 2020. Her knee swells and is painful. Limps on a regularly basis. Requesting a knee injection.   Past Medical History:  Diagnosis Date  . Arthritis    knees  . Breast cancer ([place])   . Encounter for long-term (current) use of high-risk medication 10/19/2015  . Essential hypertension 10/19/2015  . Gastroesophageal reflux disease 10/19/2015  . GERD (gastroesophageal reflux disease)   . History of breast cancer 10/19/2015   Formatting of this note might be different from the original. 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 Follows with Dr. Hinton Rao, diagnosed in 2016 started her tamoxifen and 2017 for a total of 5 years  . Hypertension   . Hypothyroidism   . Hypothyroidism (acquired) 10/19/2015  . Malaise and fatigue 06/18/2018  . Meniere's disease 10/19/2015  . Mild intermittent asthma 10/19/2015  . Pain in right knee 09/18/2018  . Personal history of radiation therapy   . PONV (postoperative nausea and vomiting)   . Seasonal allergies   . Vitamin B12 deficiency 12/17/2018  . Vitamin D deficiency 10/19/2015    Past Surgical History:  Procedure Laterality Date  . APPENDECTOMY    . BREAST BIOPSY    . BREAST EXCISIONAL BIOPSY    . BREAST LUMPECTOMY Right    2018  . BREAST LUMPECTOMY WITH RADIOACTIVE SEED LOCALIZATION Right 11/17/2014   Procedure: RIGHT RADIOACTIVE SEED LUMPECTOMY;  Surgeon: Donnie Mesa, MD;  Location: [facility];  Service: General;  Laterality: Right;  . BREAST SURGERY Left   . DILATION AND CURETTAGE OF UTERUS    . KNEE ARTHROSCOPY Left   . RE-EXCISION OF BREAST  LUMPECTOMY Right 11/30/2014   Procedure: RE-EXCISION OF RIGHT BREAST LUMPECTOMY;  Surgeon: Donnie Mesa, MD;  Location: [facility];  Service: General;  Laterality: Right;    Family History  Problem Relation Age of Onset  . Pancreatic cancer Mother   . Heart disease Father   . Colon cancer Father   . Atrial fibrillation Father   . Hypertension Sister   . Hyperthyroidism Brother   . Stroke Maternal Grandmother   . Diabetes Maternal Grandfather   . Stroke Paternal Grandmother   . Heart disease Paternal Grandfather     Social History   Socioeconomic History  . Marital status: Married    Spouse name: Not on file  . Number of children: Not on file  . Years of education: Not on file  . Highest education level: Not on file  Occupational History  . Not on file  Tobacco Use  . Smoking status: Never Smoker  . Smokeless tobacco: Never Used  Substance and Sexual Activity  . Alcohol use: No  . Drug use: No  . Sexual activity: Not on file  Other Topics Concern  . Not on file  Social History Narrative  . Not on file   Social Determinants of Health   Financial Resource Strain:   . Difficulty of Paying Living Expenses:   Food Insecurity:   . Worried About Charity fundraiser in the Last Year:   .  Ran Out of Food in the Last Year:   Transportation Needs:   . Film/video editor (Medical):   Marland Kitchen Lack of Transportation (Non-Medical):   Physical Activity:   . Days of Exercise per Week:   . Minutes of Exercise per Session:   Stress:   . Feeling of Stress :   Social Connections:   . Frequency of Communication with Friends and Family:   . Frequency of Social Gatherings with Friends and Family:   . Attends Religious Services:   . Active Member of Clubs or Organizations:   . Attends Archivist Meetings:   Marland Kitchen Marital Status:   Intimate Partner Violence:   . Fear of Current or Ex-Partner:   . Emotionally Abused:   Marland Kitchen Physically Abused:   . Sexually  Abused:     Outpatient Medications Prior to Visit  Medication Sig Dispense Refill  . albuterol (PROVENTIL HFA;VENTOLIN HFA) 108 (90 BASE) MCG/ACT inhaler Inhale 1-2 puffs into the lungs every 6 (six) hours as needed for wheezing or shortness of breath.     Marland Kitchen aspirin 81 MG chewable tablet Chew 81 mg by mouth daily.    . celecoxib (CELEBREX) 100 MG capsule Take 200 mg by mouth in the morning.    . Cholecalciferol (VITAMIN D3) 125 MCG (5000 UT) CAPS Take 5,000 Units by mouth daily.    . clorazepate (TRANXENE) 3.75 MG tablet Take 3.75 mg by mouth daily as needed (for symptoms of Mnire's disease).     . cyanocobalamin (,VITAMIN B-12,) 1000 MCG/ML injection SMARTSIG:1000 MCG IM    . hydrochlorothiazide (HYDRODIURIL) 25 MG tablet Take 25 mg by mouth daily.    Marland Kitchen levothyroxine (SYNTHROID, LEVOTHROID) 100 MCG tablet Take 100 mcg by mouth daily before breakfast.    . pantoprazole (PROTONIX) 40 MG tablet Take 40 mg by mouth daily before breakfast.     . promethazine (PHENERGAN) 25 MG tablet Take 25 mg by mouth every 6 (six) hours as needed for nausea or vomiting.    . raloxifene (EVISTA) 60 MG tablet Take 60 mg by mouth every evening.    . rosuvastatin (CRESTOR) 5 MG tablet Take 5 mg by mouth See admin instructions. Take 5 mg by mouth once a day on Tues/Thurs/Sat    . Cyanocobalamin 1000 MCG/ML KIT Inject 1,000 mcg into the skin every 30 (thirty) days.     . Cyanocobalamin (VITAMIN B-12 PO) Take 1 tablet by mouth daily.    . metoprolol tartrate (LOPRESSOR) 100 MG tablet Take 1 tablet (100 mg total) by mouth as directed. Take 2 hours prior to your Cardiac CT 1 tablet 0   No facility-administered medications prior to visit.    Allergies  Allergen Reactions  . Etodolac Rash    Review of Systems  Constitutional: Negative for chills and fever.       Objective:    Physical Exam Constitutional:      Appearance: Normal appearance. She is normal weight.  Musculoskeletal:        General: Swelling  (right knee. tender medially. patellar apprehension.) present.  Neurological:     Mental Status: She is alert.     BP 110/78 (BP Location: Right Arm, Patient Position: Sitting)   Pulse (!) 105   Temp (!) 97.5 F (36.4 C) (Temporal)   Wt 177 lb (80.3 kg)   BMI 31.35 kg/m  Wt Readings from Last 3 Encounters:  08/06/19 177 lb (80.3 kg)  06/08/19 176 lb (79.8 kg)  06/05/19 172 lb (78  kg)    Health Maintenance Due  Topic Date Due  . Hepatitis C Screening  Never done  . URINE MICROALBUMIN  Never done  . COVID-19 Vaccine (1) Never done  . HIV Screening  Never done  . TETANUS/TDAP  Never done  . PAP SMEAR-Modifier  Never done  . COLONOSCOPY  Never done    There are no preventive care reminders to display for this patient.   No results found for: TSH Lab Results  Component Value Date   WBC 5.7 06/05/2019   HGB 15.0 06/05/2019   HCT 47.4 (H) 06/05/2019   MCV 87.6 06/05/2019   PLT 272 06/05/2019   Lab Results  Component Value Date   NA 139 06/05/2019   K 3.6 06/05/2019   CO2 27 06/05/2019   GLUCOSE 100 (H) 06/05/2019   BUN 23 06/05/2019   CREATININE 0.65 06/05/2019   CALCIUM 9.6 06/05/2019   ANIONGAP 12 06/05/2019   No results found for: CHOL No results found for: HDL No results found for: LDLCALC No results found for: TRIG No results found for: CHOLHDL No results found for: HGBA1C     Assessment & Plan:  1. Chronic pain of right knee  Risks were discussed including bleeding, infection, increase in sugars if diabetic, atrophy at site of injection, and increased pain.  After consent was obtained, using sterile technique the medial right knee was prepped with betadine and alcohol.  The joint was entered and 0 ml's of fluid was withdrawn. Kenalog 80 mg and 5 ml plain Lidocaine was then injected and the needle withdrawn.  The procedure was well tolerated.   The patient is asked to continue to rest the joint for a few more days before resuming regular activities.  It  may be more painful for the first 1-2 days.  Watch for fever, or increased swelling or persistent pain in the joint. Call or return to clinic prn if such symptoms occur or there is failure to improve as anticipated.   Follow-up: Return if symptoms worsen or fail to improve.  An After Visit Summary was printed and given to the patient.  Rochel Brome [facility] [phone]

## 2019-08-11 MED FILL — HYDROCHLOROTHIAZIDE 25 MG T: 25 | 90 days supply | Qty: 90 | Fill #2

## 2019-08-11 MED FILL — LEVOTHYROXINE SODIUM 100 MC: 100 | 90 days supply | Qty: 90 | Fill #2

## 2019-08-12 MED ORDER — TRIAMCINOLONE ACETONIDE 40 MG/ML IJ SUSP: 80.0000 mg | Freq: Once | INTRAMUSCULAR | Status: DC

## 2019-08-12 NOTE — Addendum Note (Signed)
Addended byRochel Brome on: 08/12/2019 08:23 PM   Modules accepted: Orders

## 2019-08-27 ENCOUNTER — Other Ambulatory Visit: Payer: Self-pay | Admitting: Physician Assistant

## 2019-08-27 MED ORDER — DOXYCYCLINE HYCLATE 100 MG PO TABS: 100.0000 mg | ORAL_TABLET | Freq: Two times a day (BID) | ORAL | 0 refills | Status: DC

## 2019-09-01 ENCOUNTER — Other Ambulatory Visit: Payer: Self-pay | Admitting: Oncology

## 2019-09-01 DIAGNOSIS — Z9889 Other specified postprocedural states: Secondary | ICD-10-CM

## 2019-09-01 DIAGNOSIS — Z1231 Encounter for screening mammogram for malignant neoplasm of breast: Secondary | ICD-10-CM

## 2019-09-01 DIAGNOSIS — Z853 Personal history of malignant neoplasm of breast: Secondary | ICD-10-CM

## 2019-09-03 ENCOUNTER — Encounter: Payer: Self-pay | Admitting: Cardiology

## 2019-09-03 ENCOUNTER — Other Ambulatory Visit: Payer: Self-pay

## 2019-09-03 ENCOUNTER — Ambulatory Visit: Payer: 59 | Admitting: Cardiology

## 2019-09-03 VITALS — BP 134/86 | HR 90 | Ht 63.0 in | Wt 175.8 lb

## 2019-09-03 DIAGNOSIS — I5189 Other ill-defined heart diseases: Secondary | ICD-10-CM

## 2019-09-03 DIAGNOSIS — E782 Mixed hyperlipidemia: Secondary | ICD-10-CM

## 2019-09-03 DIAGNOSIS — I2510 Atherosclerotic heart disease of native coronary artery without angina pectoris: Secondary | ICD-10-CM | POA: Diagnosis not present

## 2019-09-03 DIAGNOSIS — R002 Palpitations: Secondary | ICD-10-CM | POA: Diagnosis not present

## 2019-09-03 DIAGNOSIS — E0800 Diabetes mellitus due to underlying condition with hyperosmolarity without nonketotic hyperglycemic-hyperosmolar coma (NKHHC): Secondary | ICD-10-CM

## 2019-09-03 DIAGNOSIS — I10 Essential (primary) hypertension: Secondary | ICD-10-CM

## 2019-09-03 MED ORDER — METOPROLOL TARTRATE 25 MG PO TABS: 25.0000 mg | ORAL_TABLET | ORAL | 0 refills | Status: DC | PRN

## 2019-09-03 MED FILL — METOPROLOL TARTRATE 25 MG T: 25 | 30 days supply | Qty: 30 | Fill #0

## 2019-09-03 NOTE — Patient Instructions (Signed)
Medication Instructions:  Your physician has recommended you make the following change in your medication:   Start Lopressor 12.5 mg as needed daily for palpitations.  *If you need a refill on your cardiac medications before your next appointment, please call your pharmacy*   Lab Work: None ordered If you have labs (blood work) drawn today and your tests are completely normal, you will receive your results only by:  [place] (if you have MyChart) OR  A paper copy in the mail If you have any lab test that is abnormal or we need to change your treatment, we will call you to review the results.   Testing/Procedures: None ordered   Follow-Up: At [facility], you and your health needs are our priority.  As part of our continuing mission to provide you with exceptional heart care, we have created designated Provider Care Teams.  These Care Teams include your primary Cardiologist (physician) and Advanced Practice Providers (APPs -  Physician Assistants and Nurse Practitioners) who all work together to provide you with the care you need, when you need it.  We recommend signing up for the patient portal called "MyChart".  Sign up information is provided on this After Visit Summary.  MyChart is used to connect with patients for Virtual Visits (Telemedicine).  Patients are able to view lab/test results, encounter notes, upcoming appointments, etc.  Non-urgent messages can be sent to your provider as well.   To learn more about what you can do with MyChart, go to [email or web address].    Your next appointment:   1 year(s)  The format for your next appointment:   In Person  Provider:   Berniece Salines, DO   Other Instructions Metoprolol Injection What is this medicine? METOPROLOL (me TOE proe lole) is a beta blocker. It decreases the amount of work your heart has to do and helps your heart beat regularly. It is used after a heart attack to prevent a second one. This medicine may  be used for other purposes; ask your health care provider or pharmacist if you have questions. COMMON BRAND NAME(S): Lopressor What should I tell my health care provider before I take this medicine? They need to know if you have any of these conditions:  diabetes  heart or vessel disease like slow heart rate, worsening heart failure, heart block, sick sinus syndrome or Raynaud's disease  kidney disease  liver disease  lung or breathing disease, like asthma or emphysema  pheochromocytoma  thyroid disease  an unusual or allergic reaction to metoprolol, other beta-blockers, medicines, foods, dyes, or preservatives  pregnant or trying to get pregnant  breast-feeding How should I use this medicine? This drug is injected into a vein. It is given by a health care provider in a hospital or clinic setting. Talk to your health care provider about the use of this drug in children. Special care may be needed. Overdosage: If you think you have taken too much of this medicine contact a poison control center or emergency room at once. NOTE: This medicine is only for you. Do not share this medicine with others. What if I miss a dose? This does not apply. This drug is not for regular use. What may interact with this medicine? This medicine may interact with the following medications:  certain medicines for blood pressure, heart disease, irregular heart beat  certain medicines for depression like monoamine oxidase (MAO) inhibitors, fluoxetine, or paroxetine  clonidine  dobutamine  epinephrine  isoproterenol  reserpine This list may  not describe all possible interactions. Give your health care provider a list of all the medicines, herbs, non-prescription drugs, or dietary supplements you use. Also tell them if you smoke, drink alcohol, or use illegal drugs. Some items may interact with your medicine. What should I watch for while using this medicine? Your condition will be monitored  carefully while you are receiving this medicine. This medicine may increase blood sugar. Ask your healthcare provider if changes in diet or medicines are needed if you have diabetes. What side effects may I notice from receiving this medicine? Side effects that you should report to your doctor or health care professional as soon as possible:  allergic reactions like skin rash, itching or hives  breathing problems  cold or numb hands or feet  depression  feeling faint  fever with sore throat  irregular heartbeat, chest pain  rapid weight gain   signs and symptoms of high blood sugar such as being more thirsty or hungry or having to urinate more than normal. You may also feel very tired or have blurry vision.  swollen legs or ankles Side effects that usually do not require medical attention (report to your doctor or health care professional if they continue or are bothersome):  anxiety or nervousness  change in sex drive or performance  dry skin  headache  nightmares or trouble sleeping  short term memory loss  stomach upset or diarrhea This list may not describe all possible side effects. Call your doctor for medical advice about side effects. You may report side effects to FDA at 1-800-FDA-1088. Where should I keep my medicine? This drug is given in a hospital or clinic. It will not be stored at home. NOTE: This sheet is a summary. It may not cover all possible information. If you have questions about this medicine, talk to your doctor, pharmacist, or health care provider.  2020 Elsevier/Gold Standard (2018-09-18 17:41:21)

## 2019-09-03 NOTE — Progress Notes (Signed)
Cardiology Office Note:    Date:  09/03/2019   ID:  Amanda Haas, DOB [birth year], MRN [id number]  PCP:  Mayer Camel, NP  Cardiologist:  Berniece Salines, DO  Electrophysiologist:  None   Referring MD: Bess Harvest*   " I have been doing fine but has had some episodes of palpitation that did not last long"  History of Present Illness:    Amanda Haas is a 64 y.o. 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  I did see the patient in April 2021 at that time she has been experiencing chest pain with palpitations.  I recommend she undergo coronary CTA with wearing ZIO monitor and an echocardiogram.  In the interim she was able to get this testing done.  Her results had prior been called out to her.  Today she is here for follow-up visit. She tells me that she has been doing well.  No chest pain since I last saw her.  However she has had short burst of palpitation.  Past Medical History:  Diagnosis Date  . Arthritis    knees  . Breast cancer ([place])   . Encounter for long-term (current) use of high-risk medication 10/19/2015  . Essential hypertension 10/19/2015  . Gastroesophageal reflux disease 10/19/2015  . GERD (gastroesophageal reflux disease)   . History of breast cancer 10/19/2015   Formatting of this note might be different from the original. 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 Follows with Dr. Hinton Rao, diagnosed in 2016 started her tamoxifen and 2017 for a total of 5 years  . Hypertension   . Hypothyroidism   . Hypothyroidism  (acquired) 10/19/2015  . Malaise and fatigue 06/18/2018  . Meniere's disease 10/19/2015  . Mild intermittent asthma 10/19/2015  . Pain in right knee 09/18/2018  . Personal history of radiation therapy   . PONV (postoperative nausea and vomiting)   . Seasonal allergies   . Vitamin B12 deficiency 12/17/2018  . Vitamin D deficiency 10/19/2015    Past Surgical History:  Procedure Laterality Date  . APPENDECTOMY    . BREAST BIOPSY    . BREAST EXCISIONAL BIOPSY    . BREAST LUMPECTOMY Right    2018  . BREAST LUMPECTOMY WITH RADIOACTIVE SEED LOCALIZATION Right 11/17/2014   Procedure: RIGHT RADIOACTIVE SEED LUMPECTOMY;  Surgeon: Donnie Mesa, MD;  Location: [facility];  Service: General;  Laterality: Right;  . BREAST SURGERY Left   . DILATION AND CURETTAGE OF UTERUS    . KNEE ARTHROSCOPY Left   . RE-EXCISION OF BREAST LUMPECTOMY Right 11/30/2014   Procedure: RE-EXCISION OF RIGHT BREAST LUMPECTOMY;  Surgeon: Donnie Mesa, MD;  Location: [facility];  Service: General;  Laterality: Right;    Current Medications: Current Meds  Medication Sig  . albuterol (PROVENTIL HFA;VENTOLIN HFA) 108 (90 BASE) MCG/ACT inhaler Inhale 1-2 puffs into the lungs every 6 (six) hours as needed for wheezing or shortness of breath.   Marland Kitchen aspirin 81 MG chewable tablet Chew 81 mg by mouth daily.  . celecoxib (CELEBREX) 100 MG capsule Take 100 mg by mouth daily.   . Cholecalciferol (VITAMIN  D3) 125 MCG (5000 UT) CAPS Take 5,000 Units by mouth daily.  . clorazepate (TRANXENE) 3.75 MG tablet Take 3.75 mg by mouth daily as needed (for symptoms of Mnire's disease).   . cyanocobalamin (,VITAMIN B-12,) 1000 MCG/ML injection every 30 (thirty) days.   Marland Kitchen doxycycline (VIBRA-TABS) 100 MG tablet Take 1 tablet (100 mg total) by mouth 2 (two) times daily.  . hydrochlorothiazide (HYDRODIURIL) 25 MG tablet Take 25 mg by mouth daily.  Marland Kitchen levothyroxine (SYNTHROID, LEVOTHROID) 100 MCG tablet Take 100 mcg by  mouth daily before breakfast.  . pantoprazole (PROTONIX) 40 MG tablet Take 40 mg by mouth daily before breakfast.   . promethazine (PHENERGAN) 25 MG tablet Take 25 mg by mouth every 6 (six) hours as needed for nausea or vomiting.  . raloxifene (EVISTA) 60 MG tablet Take 60 mg by mouth every evening.  . rosuvastatin (CRESTOR) 5 MG tablet Take 5 mg by mouth See admin instructions. Take 5 mg by mouth once a day on Tues/Thurs/Sat   Current Facility-Administered Medications for the 09/03/19 encounter (Office Visit) with Berniece Salines, DO  Medication  . triamcinolone acetonide (KENALOG-40) injection 80 mg     Allergies:   Etodolac   Social History   Socioeconomic History  . Marital status: Married    Spouse name: Not on file  . Number of children: Not on file  . Years of education: Not on file  . Highest education level: Not on file  Occupational History  . Not on file  Tobacco Use  . Smoking status: Never Smoker  . Smokeless tobacco: Never Used  Substance and Sexual Activity  . Alcohol use: No  . Drug use: No  . Sexual activity: Not on file  Other Topics Concern  . Not on file  Social History Narrative  . Not on file   Social Determinants of Health   Financial Resource Strain:   . Difficulty of Paying Living Expenses:   Food Insecurity:   . Worried About Charity fundraiser in the Last Year:   . Arboriculturist in the Last Year:   Transportation Needs:   . Film/video editor (Medical):   Marland Kitchen Lack of Transportation (Non-Medical):   Physical Activity:   . Days of Exercise per Week:   . Minutes of Exercise per Session:   Stress:   . Feeling of Stress :   Social Connections:   . Frequency of Communication with Friends and Family:   . Frequency of Social Gatherings with Friends and Family:   . Attends Religious Services:   . Active Member of Clubs or Organizations:   . Attends Archivist Meetings:   Marland Kitchen Marital Status:      Family History: The patient's  family history includes Atrial fibrillation in her father; Colon cancer in her father; Diabetes in her maternal grandfather; Heart disease in her father and paternal grandfather; Hypertension in her sister; Hyperthyroidism in her brother; Pancreatic cancer in her mother; Stroke in her maternal grandmother and paternal grandmother.  ROS:   Review of Systems  Constitution: Negative for decreased appetite, fever and weight gain.  HENT: Negative for congestion, ear discharge, hoarse voice and sore throat.   Eyes: Negative for discharge, redness, vision loss in right eye and visual halos.  Cardiovascular: Negative for chest pain, dyspnea on exertion, leg swelling, orthopnea and palpitations.  Respiratory: Negative for cough, hemoptysis, shortness of breath and snoring.   Endocrine: Negative for heat intolerance and polyphagia.  Hematologic/Lymphatic: Negative for  bleeding problem. Does not bruise/bleed easily.  Skin: Negative for flushing, nail changes, rash and suspicious lesions.  Musculoskeletal: Negative for arthritis, joint pain, muscle cramps, myalgias, neck pain and stiffness.  Gastrointestinal: Negative for abdominal pain, bowel incontinence, diarrhea and excessive appetite.  Genitourinary: Negative for decreased libido, genital sores and incomplete emptying.  Neurological: Negative for brief paralysis, focal weakness, headaches and loss of balance.  Psychiatric/Behavioral: Negative for altered mental status, depression and suicidal ideas.  Allergic/Immunologic: Negative for HIV exposure and persistent infections.    EKGs/Labs/Other Studies Reviewed:    The following studies were reviewed today:   EKG:  The ekg ordered today demonstrates   CCTA IMPRESSION: 1. Coronary calcium score of 26. This was 85 percentile for age and sex matched control.  2. Normal coronary origin with right dominance.  3. Mild CAD. CADRADS 2. Aggressive medical management is recommended.    Zio  Monitor  The patient wore the monitor for 7 days 19 hours starting 06/08/2019 . Indication: Palpitations  The minimum heart rate was 66 bpm, maximum heart rate was 165 bpm, and average heart rate was 90 bpm. Predominant underlying rhythm was Sinus Rhythm.   Premature atrial complexes were rare (less than 1%). Premature Ventricular complexes were rare (less than 1%).  No ventricular tachycardia, no pauses, No AV block, no supraventricular tachycardia and no atrial fibrillation present. 4 patient triggered events and 1 diary event noted all associated with sinus rhythm.   Conclusion: Normal/unremarkable study.   Echo IMPRESSIONS  1. Left ventricular ejection fraction, by estimation, is 60 to 65%. The  left ventricle has normal function. The left ventricle has no regional  wall motion abnormalities. Left ventricular diastolic parameters are  consistent with Grade I diastolic  dysfunction (impaired relaxation).   FINDINGS  Left Ventricle: Left ventricular ejection fraction, by estimation, is 60  to 65%. The left ventricle has normal function. The left ventricle has no  regional wall motion abnormalities. The left ventricular internal cavity  size was normal in size. There is  no left ventricular hypertrophy. Left ventricular diastolic parameters  are consistent with Grade I diastolic dysfunction (impaired relaxation).   Right Ventricle: The right ventricular size is normal. No increase in  right ventricular wall thickness. Right ventricular systolic function is  normal. There is normal pulmonary artery systolic pressure. The tricuspid  regurgitant velocity is 1.78 m/s, and  with an assumed right atrial pressure of 3 mmHg, the estimated right  ventricular systolic pressure is 64.3 mmHg.   Left Atrium: Left atrial size was normal in size.   Right Atrium: Right atrial size was normal in size.   Pericardium: There is no evidence of pericardial effusion.   Mitral Valve:  The mitral valve is normal in structure. Normal mobility of  the mitral valve leaflets. No evidence of mitral valve regurgitation. No  evidence of mitral valve stenosis.   Tricuspid Valve: The tricuspid valve is normal in structure. Tricuspid  valve regurgitation is trivial. No evidence of tricuspid stenosis.   Aortic Valve: The aortic valve is normal in structure. Aortic valve  regurgitation is not visualized. No aortic stenosis is present.   Pulmonic Valve: The pulmonic valve was normal in structure. Pulmonic valve  regurgitation is not visualized. No evidence of pulmonic stenosis.   Aorta: The aortic root is normal in size and structure.   Venous: The inferior vena cava is normal in size with greater than 50%  respiratory variability, suggesting right atrial pressure of 3 mmHg.   IAS/Shunts:  No atrial level shunt detected by color flow Doppler  Recent Labs: 06/05/2019: BUN 23; Creatinine, Ser 0.65; Hemoglobin 15.0; Platelets 272; Potassium 3.6; Sodium 139  Recent Lipid Panel No results found for: CHOL, TRIG, HDL, CHOLHDL, VLDL, LDLCALC, LDLDIRECT  Physical Exam:    VS:  BP 134/86 (BP Location: Left Arm, Patient Position: Sitting, Cuff Size: Normal)   Pulse 90   Ht 5\' 3"  (1.6 m)   Wt 175 lb 12.8 oz (79.7 kg)   SpO2 97%   BMI 31.14 kg/m     Wt Readings from Last 3 Encounters:  09/03/19 175 lb 12.8 oz (79.7 kg)  08/06/19 177 lb (80.3 kg)  06/08/19 176 lb (79.8 kg)     GEN: Well nourished, well developed in no acute distress HEENT: Normal NECK: No JVD; No carotid bruits LYMPHATICS: No lymphadenopathy CARDIAC: S1S2 noted,RRR, no murmurs, rubs, gallops RESPIRATORY:  Clear to auscultation without rales, wheezing or rhonchi  ABDOMEN: Soft, non-tender, non-distended, +bowel sounds, no guarding. EXTREMITIES: No edema, No cyanosis, no clubbing MUSCULOSKELETAL:  No deformity  SKIN: Warm and dry NEUROLOGIC:  Alert and oriented x 3, non-focal PSYCHIATRIC:  Normal affect, good  insight  ASSESSMENT:    1. Palpitations   2. Mild CAD   3. Diastolic dysfunction   4. Essential hypertension   5. Mixed hyperlipidemia   6. Diabetes mellitus due to underlying condition with hyperosmolarity without coma, without long-term current use of insulin ([place])    PLAN:     1.  I again reviewed reviewed her testing results with her.  Her echocardiogram did show evidence of diastolic dysfunction with no other valvular abnormalities.  Coronary CTA showed mild coronary artery disease as well as ZIO monitor did not show any significant finding.  2.  Mild coronary artery disease-no angina symptoms.  She is on aspirin 81 mg daily along with Crestor 5 mg daily we will continue this.  3.  Since she still is having intermittent palpitations for now we will do as needed Lopressor.  I dducated the patient that if the palpitation gets significantly worse on a given day to check her blood pressure as long as is greater than 973 systolic she will be able to take 12.5 mg Lopressor.  When she takes this medication she will sit about 10 to 15 minutes prior to that we assuming her activities.  4.  Hypertension-her blood pressure deceptively well in the office today no changes in her medication.  5.  Hyperlipidemia continue patient her Crestor.  6.  Type 2 diabetes-continue patient on her current medication per PCP  The patient is in agreement with the above plan. The patient left the office in stable condition.  The patient will follow up in 1 year or sooner if needed.   Medication Adjustments/Labs and Tests Ordered: Current medicines are reviewed at length with the patient today.  Concerns regarding medicines are outlined above.  No orders of the defined types were placed in this encounter.  Meds ordered this encounter  Medications  . metoprolol tartrate (LOPRESSOR) 25 MG tablet    Sig: Take 1 tablet (25 mg total) by mouth as needed.    Dispense:  30 tablet    Refill:  0    Patient  Instructions  Medication Instructions:  Your physician has recommended you make the following change in your medication:   Start Lopressor 12.5 mg as needed daily for palpitations.  *If you need a refill on your cardiac medications before your next appointment, please call  your pharmacy*   Lab Work: None ordered If you have labs (blood work) drawn today and your tests are completely normal, you will receive your results only by: Marland Kitchen MyChart Message (if you have MyChart) OR . A paper copy in the mail If you have any lab test that is abnormal or we need to change your treatment, we will call you to review the results.   Testing/Procedures: None ordered   Follow-Up: At [facility], you and your health needs are our priority.  As part of our continuing mission to provide you with exceptional heart care, we have created designated Provider Care Teams.  These Care Teams include your primary Cardiologist (physician) and Advanced Practice Providers (APPs -  Physician Assistants and Nurse Practitioners) who all work together to provide you with the care you need, when you need it.  We recommend signing up for the patient portal called "MyChart".  Sign up information is provided on this After Visit Summary.  MyChart is used to connect with patients for Virtual Visits (Telemedicine).  Patients are able to view lab/test results, encounter notes, upcoming appointments, etc.  Non-urgent messages can be sent to your provider as well.   To learn more about what you can do with MyChart, go to [email or web address].    Your next appointment:   1 year(s)  The format for your next appointment:   In Person  Provider:   Berniece Salines, DO   Other Instructions Metoprolol Injection What is this medicine? METOPROLOL (me TOE proe lole) is a beta blocker. It decreases the amount of work your heart has to do and helps your heart beat regularly. It is used after a heart attack to prevent a second  one. This medicine may be used for other purposes; ask your health care provider or pharmacist if you have questions. COMMON BRAND NAME(S): Lopressor What should I tell my health care provider before I take this medicine? They need to know if you have any of these conditions:  diabetes  heart or vessel disease like slow heart rate, worsening heart failure, heart block, sick sinus syndrome or Raynaud's disease  kidney disease  liver disease  lung or breathing disease, like asthma or emphysema  pheochromocytoma  thyroid disease  an unusual or allergic reaction to metoprolol, other beta-blockers, medicines, foods, dyes, or preservatives  pregnant or trying to get pregnant  breast-feeding How should I use this medicine? This drug is injected into a vein. It is given by a health care provider in a hospital or clinic setting. Talk to your health care provider about the use of this drug in children. Special care may be needed. Overdosage: If you think you have taken too much of this medicine contact a poison control center or emergency room at once. NOTE: This medicine is only for you. Do not share this medicine with others. What if I miss a dose? This does not apply. This drug is not for regular use. What may interact with this medicine? This medicine may interact with the following medications:  certain medicines for blood pressure, heart disease, irregular heart beat  certain medicines for depression like monoamine oxidase (MAO) inhibitors, fluoxetine, or paroxetine  clonidine  dobutamine  epinephrine  isoproterenol  reserpine This list may not describe all possible interactions. Give your health care provider a list of all the medicines, herbs, non-prescription drugs, or dietary supplements you use. Also tell them if you smoke, drink alcohol, or use illegal drugs. Some items may interact with your  medicine. What should I watch for while using this medicine? Your  condition will be monitored carefully while you are receiving this medicine. This medicine may increase blood sugar. Ask your healthcare provider if changes in diet or medicines are needed if you have diabetes. What side effects may I notice from receiving this medicine? Side effects that you should report to your doctor or health care professional as soon as possible:  allergic reactions like skin rash, itching or hives  breathing problems  cold or numb hands or feet  depression  feeling faint  fever with sore throat  irregular heartbeat, chest pain  rapid weight gain   signs and symptoms of high blood sugar such as being more thirsty or hungry or having to urinate more than normal. You may also feel very tired or have blurry vision.  swollen legs or ankles Side effects that usually do not require medical attention (report to your doctor or health care professional if they continue or are bothersome):  anxiety or nervousness  change in sex drive or performance  dry skin  headache  nightmares or trouble sleeping  short term memory loss  stomach upset or diarrhea This list may not describe all possible side effects. Call your doctor for medical advice about side effects. You may report side effects to FDA at 1-800-FDA-1088. Where should I keep my medicine? This drug is given in a hospital or clinic. It will not be stored at home. NOTE: This sheet is a summary. It may not cover all possible information. If you have questions about this medicine, talk to your doctor, pharmacist, or health care provider.  2020 Elsevier/Gold Standard (2018-09-18 17:41:21)      Adopting a Healthy Lifestyle.  Know what a healthy weight is for you (roughly BMI <25) and aim to maintain this   Aim for 7+ servings of fruits and vegetables daily   65-80+ fluid ounces of water or unsweet tea for healthy kidneys   Limit to max 1 drink of alcohol per day; avoid smoking/tobacco   Limit  animal fats in diet for cholesterol and heart health - choose grass fed whenever available   Avoid highly processed foods, and foods high in saturated/trans fats   Aim for low stress - take time to unwind and care for your mental health   Aim for 150 min of moderate intensity exercise weekly for heart health, and weights twice weekly for bone health   Aim for 7-9 hours of sleep daily   When it comes to diets, agreement about the perfect plan isnt easy to find, even among the experts. Experts at the [place] developed an idea known as the Healthy Eating Plate. Just imagine a plate divided into logical, healthy portions.   The emphasis is on diet quality:   Load up on vegetables and fruits - one-half of your plate: Aim for color and variety, and remember that potatoes dont count.   Go for whole grains - one-quarter of your plate: Whole wheat, barley, wheat berries, quinoa, oats, brown rice, and foods made with them. If you want pasta, go with whole wheat pasta.   Protein power - one-quarter of your plate: Fish, chicken, beans, and nuts are all healthy, versatile protein sources. Limit red meat.   The diet, however, does go beyond the plate, offering a few other suggestions.   Use healthy plant oils, such as olive, canola, soy, corn, sunflower and peanut. Check the labels, and avoid partially hydrogenated oil,  which have unhealthy trans fats.   If youre thirsty, drink water. Coffee and tea are good in moderation, but skip sugary drinks and limit milk and dairy products to one or two daily servings.   The type of carbohydrate in the diet is more important than the amount. Some sources of carbohydrates, such as vegetables, fruits, whole grains, and beans-are healthier than others.   Finally, stay active  Signed, Berniece Salines, DO  09/03/2019 8:58 AM    [facility]

## 2019-09-07 MED FILL — CYANOCOBALAMIN 1,000 MCG/ML: 1000 | 30 days supply | Qty: 1 | Fill #1

## 2019-09-07 MED FILL — PANTOPRAZOLE SOD DR 40 MG T: 40 | 90 days supply | Qty: 90 | Fill #1

## 2019-09-28 ENCOUNTER — Other Ambulatory Visit: Payer: Self-pay | Admitting: Oncology

## 2019-09-28 DIAGNOSIS — Z853 Personal history of malignant neoplasm of breast: Secondary | ICD-10-CM

## 2019-09-28 DIAGNOSIS — Z9889 Other specified postprocedural states: Secondary | ICD-10-CM

## 2019-10-01 ENCOUNTER — Ambulatory Visit
Admission: RE | Admit: 2019-10-01 | Discharge: 2019-10-01 | Disposition: A | Discharge status: Home / Self Care | Payer: 59 | Source: Ambulatory Visit | Attending: Oncology | Admitting: Oncology

## 2019-10-01 ENCOUNTER — Other Ambulatory Visit: Payer: Self-pay

## 2019-10-01 DIAGNOSIS — Z853 Personal history of malignant neoplasm of breast: Secondary | ICD-10-CM

## 2019-10-01 DIAGNOSIS — Z9889 Other specified postprocedural states: Secondary | ICD-10-CM

## 2019-10-01 DIAGNOSIS — R928 Other abnormal and inconclusive findings on diagnostic imaging of breast: Secondary | ICD-10-CM | POA: Diagnosis not present

## 2019-10-07 DIAGNOSIS — Z803 Family history of malignant neoplasm of breast: Secondary | ICD-10-CM | POA: Diagnosis not present

## 2019-10-07 DIAGNOSIS — Z800 Family history of malignant neoplasm of digestive organs: Secondary | ICD-10-CM | POA: Diagnosis not present

## 2019-10-07 DIAGNOSIS — Z808 Family history of malignant neoplasm of other organs or systems: Secondary | ICD-10-CM | POA: Diagnosis not present

## 2019-10-07 DIAGNOSIS — Z86000 Personal history of in-situ neoplasm of breast: Secondary | ICD-10-CM | POA: Diagnosis not present

## 2019-10-07 DIAGNOSIS — D0511 Intraductal carcinoma in situ of right breast: Secondary | ICD-10-CM | POA: Diagnosis not present

## 2019-10-07 DIAGNOSIS — Z79810 Long term (current) use of selective estrogen receptor modulators (SERMs): Secondary | ICD-10-CM | POA: Diagnosis not present

## 2019-10-09 MED FILL — CYANOCOBALAMIN 1,000 MCG/ML: 1000 | 30 days supply | Qty: 1 | Fill #2

## 2019-10-09 MED FILL — RALOXIFENE HCL 60 MG TABS: 60 | 90 days supply | Qty: 90 | Fill #1

## 2019-11-09 MED FILL — CELECOXIB 100 MG CAPS: 100 | 90 days supply | Qty: 180 | Fill #3

## 2019-11-09 MED FILL — LEVOTHYROXINE SODIUM 100 MC: 100 | 90 days supply | Qty: 90 | Fill #3

## 2019-11-09 MED FILL — ROSUVASTATIN CALCIUM 5 MG T: 5 | 90 days supply | Qty: 90 | Fill #1

## 2019-11-09 MED FILL — HYDROCHLOROTHIAZIDE 25 MG T: 25 | 90 days supply | Qty: 90 | Fill #3

## 2019-12-11 ENCOUNTER — Other Ambulatory Visit (HOSPITAL_COMMUNITY): Payer: Self-pay | Admitting: Specialist

## 2019-12-11 MED FILL — CYANOCOBALAMIN 1,000 MCG/ML: 1000 | 30 days supply | Qty: 1 | Fill #0

## 2019-12-11 MED FILL — PANTOPRAZOLE SOD DR 40 MG T: 40 | 90 days supply | Qty: 90 | Fill #0

## 2019-12-14 ENCOUNTER — Ambulatory Visit (INDEPENDENT_AMBULATORY_CARE_PROVIDER_SITE_OTHER): Payer: 59

## 2019-12-14 DIAGNOSIS — Z23 Encounter for immunization: Secondary | ICD-10-CM

## 2019-12-30 ENCOUNTER — Other Ambulatory Visit (HOSPITAL_COMMUNITY): Payer: Self-pay | Admitting: Family Medicine

## 2019-12-30 DIAGNOSIS — E039 Hypothyroidism, unspecified: Secondary | ICD-10-CM | POA: Diagnosis not present

## 2019-12-30 DIAGNOSIS — I10 Essential (primary) hypertension: Secondary | ICD-10-CM | POA: Diagnosis not present

## 2019-12-30 DIAGNOSIS — I2510 Atherosclerotic heart disease of native coronary artery without angina pectoris: Secondary | ICD-10-CM | POA: Diagnosis not present

## 2019-12-30 DIAGNOSIS — Z0000 Encounter for general adult medical examination without abnormal findings: Secondary | ICD-10-CM | POA: Diagnosis not present

## 2019-12-30 DIAGNOSIS — Z0001 Encounter for general adult medical examination with abnormal findings: Secondary | ICD-10-CM | POA: Diagnosis not present

## 2019-12-30 DIAGNOSIS — E538 Deficiency of other specified B group vitamins: Secondary | ICD-10-CM | POA: Diagnosis not present

## 2019-12-30 DIAGNOSIS — Z79899 Other long term (current) drug therapy: Secondary | ICD-10-CM | POA: Diagnosis not present

## 2019-12-30 DIAGNOSIS — E782 Mixed hyperlipidemia: Secondary | ICD-10-CM | POA: Diagnosis not present

## 2019-12-30 DIAGNOSIS — Z130 Encounter for screening for diseases of the blood and blood-forming organs and certain disorders involving the immune mechanism: Secondary | ICD-10-CM | POA: Diagnosis not present

## 2019-12-30 DIAGNOSIS — Z13228 Encounter for screening for other metabolic disorders: Secondary | ICD-10-CM | POA: Diagnosis not present

## 2019-12-30 DIAGNOSIS — R7303 Prediabetes: Secondary | ICD-10-CM | POA: Diagnosis not present

## 2019-12-30 MED FILL — CLORAZEPATE 3.75 MG TABLET: 3.75 | 20 days supply | Qty: 60 | Fill #0

## 2020-01-12 MED FILL — RALOXIFENE HCL 60 MG TABS: 60 | 90 days supply | Qty: 90 | Fill #2

## 2020-02-03 ENCOUNTER — Other Ambulatory Visit (HOSPITAL_COMMUNITY): Payer: Self-pay | Admitting: Specialist

## 2020-02-03 MED FILL — CELECOXIB 100 MG CAPS: 100 | 90 days supply | Qty: 180 | Fill #0

## 2020-02-08 MED FILL — HYDROCHLOROTHIAZIDE 25 MG T: 25 | 90 days supply | Qty: 90 | Fill #0

## 2020-02-08 MED FILL — LEVOTHYROXINE SODIUM 100 MC: 100 | 90 days supply | Qty: 90 | Fill #0

## 2020-03-09 ENCOUNTER — Other Ambulatory Visit (HOSPITAL_COMMUNITY): Payer: Self-pay | Admitting: Specialist

## 2020-03-09 MED FILL — CYANOCOBALAMIN 1,000 MCG/ML: 1000 | 30 days supply | Qty: 1 | Fill #0

## 2020-03-09 MED FILL — PANTOPRAZOLE SOD DR 40 MG T: 40 | 90 days supply | Qty: 90 | Fill #0

## 2020-05-09 MED FILL — HYDROCHLOROTHIAZIDE 25 MG T: 25 | 90 days supply | Qty: 90 | Fill #1

## 2020-05-09 MED FILL — LEVOTHYROXINE SODIUM 100 MC: 100 | 90 days supply | Qty: 90 | Fill #1

## 2020-05-09 MED FILL — CYANOCOBALAMIN 1,000 MCG/ML: 1000 | 30 days supply | Qty: 1 | Fill #1

## 2020-05-12 ENCOUNTER — Other Ambulatory Visit (HOSPITAL_BASED_OUTPATIENT_CLINIC_OR_DEPARTMENT_OTHER): Payer: Self-pay

## 2020-06-13 ENCOUNTER — Other Ambulatory Visit (HOSPITAL_COMMUNITY): Payer: Self-pay

## 2020-06-13 MED FILL — Cyanocobalamin Inj 1000 MCG/ML: INTRAMUSCULAR | 30 days supply | Qty: 1 | Fill #0 | Status: AC

## 2020-06-13 MED FILL — Pantoprazole Sodium EC Tab 40 MG (Base Equiv): ORAL | 90 days supply | Qty: 90 | Fill #0 | Status: AC

## 2020-06-14 ENCOUNTER — Other Ambulatory Visit (HOSPITAL_COMMUNITY): Payer: Self-pay

## 2020-06-15 ENCOUNTER — Other Ambulatory Visit (HOSPITAL_COMMUNITY): Payer: Self-pay

## 2020-06-15 MED ORDER — CELECOXIB 100 MG PO CAPS
ORAL_CAPSULE | ORAL | 0 refills | Status: DC
  Filled 2020-06-15: qty 180, 90d supply, fill #0

## 2020-06-16 ENCOUNTER — Other Ambulatory Visit (HOSPITAL_COMMUNITY): Payer: Self-pay

## 2020-06-29 ENCOUNTER — Other Ambulatory Visit (HOSPITAL_COMMUNITY): Payer: Self-pay

## 2020-06-29 DIAGNOSIS — E039 Hypothyroidism, unspecified: Secondary | ICD-10-CM | POA: Diagnosis not present

## 2020-06-29 DIAGNOSIS — J4520 Mild intermittent asthma, uncomplicated: Secondary | ICD-10-CM | POA: Diagnosis not present

## 2020-06-29 DIAGNOSIS — H8103 Meniere's disease, bilateral: Secondary | ICD-10-CM | POA: Diagnosis not present

## 2020-06-29 DIAGNOSIS — K219 Gastro-esophageal reflux disease without esophagitis: Secondary | ICD-10-CM | POA: Diagnosis not present

## 2020-06-29 DIAGNOSIS — R5381 Other malaise: Secondary | ICD-10-CM | POA: Diagnosis not present

## 2020-06-29 DIAGNOSIS — I2510 Atherosclerotic heart disease of native coronary artery without angina pectoris: Secondary | ICD-10-CM | POA: Diagnosis not present

## 2020-06-29 DIAGNOSIS — E782 Mixed hyperlipidemia: Secondary | ICD-10-CM | POA: Diagnosis not present

## 2020-06-29 DIAGNOSIS — E538 Deficiency of other specified B group vitamins: Secondary | ICD-10-CM | POA: Diagnosis not present

## 2020-06-29 DIAGNOSIS — R7303 Prediabetes: Secondary | ICD-10-CM | POA: Diagnosis not present

## 2020-06-29 DIAGNOSIS — R5383 Other fatigue: Secondary | ICD-10-CM | POA: Diagnosis not present

## 2020-06-29 DIAGNOSIS — I5189 Other ill-defined heart diseases: Secondary | ICD-10-CM | POA: Diagnosis not present

## 2020-06-29 DIAGNOSIS — E559 Vitamin D deficiency, unspecified: Secondary | ICD-10-CM | POA: Diagnosis not present

## 2020-06-29 MED ORDER — CLORAZEPATE DIPOTASSIUM 3.75 MG PO TABS
ORAL_TABLET | ORAL | 0 refills | Status: DC
  Filled 2020-06-29: qty 60, 20d supply, fill #0

## 2020-06-29 MED ORDER — LEVOTHYROXINE SODIUM 100 MCG PO TABS
100.0000 ug | ORAL_TABLET | Freq: Every day | ORAL | 3 refills | Status: DC
  Filled 2020-06-29: qty 90, 90d supply, fill #0

## 2020-06-29 MED ORDER — PANTOPRAZOLE SODIUM 40 MG PO TBEC
1.0000 | DELAYED_RELEASE_TABLET | Freq: Every day | ORAL | 3 refills | Status: DC
  Filled 2020-06-29 – 2020-09-06 (×2): qty 90, 90d supply, fill #0
  Filled 2020-12-01: qty 90, 90d supply, fill #1
  Filled 2021-03-10: qty 90, 90d supply, fill #2
  Filled 2021-06-09: qty 90, 90d supply, fill #3

## 2020-06-29 MED ORDER — CELECOXIB 100 MG PO CAPS
ORAL_CAPSULE | ORAL | 0 refills | Status: DC
  Filled 2020-06-29: qty 180, 90d supply, fill #0

## 2020-06-29 MED ORDER — HYDROCHLOROTHIAZIDE 25 MG PO TABS
1.0000 | ORAL_TABLET | Freq: Every day | ORAL | 3 refills | Status: DC
  Filled 2020-06-29: qty 90, 90d supply, fill #0

## 2020-06-29 MED ORDER — CYANOCOBALAMIN 1000 MCG/ML IJ SOLN
INTRAMUSCULAR | 11 refills | Status: DC
  Filled 2020-06-29: qty 1, 30d supply, fill #0

## 2020-07-09 IMAGING — MG DIGITAL DIAGNOSTIC BILATERAL MAMMOGRAM WITH TOMO AND CAD
6 of 9 series · 6 of 25 positions shown · non-contrast
Comparison: Previous exam(s).

CLINICAL DATA: 63-year-old female for annual mammogram follow-up.
History of RIGHT breast cancer and lumpectomy in 0296.

EXAM:
DIGITAL DIAGNOSTIC BILATERAL MAMMOGRAM WITH CAD AND TOMO

[R MLO]
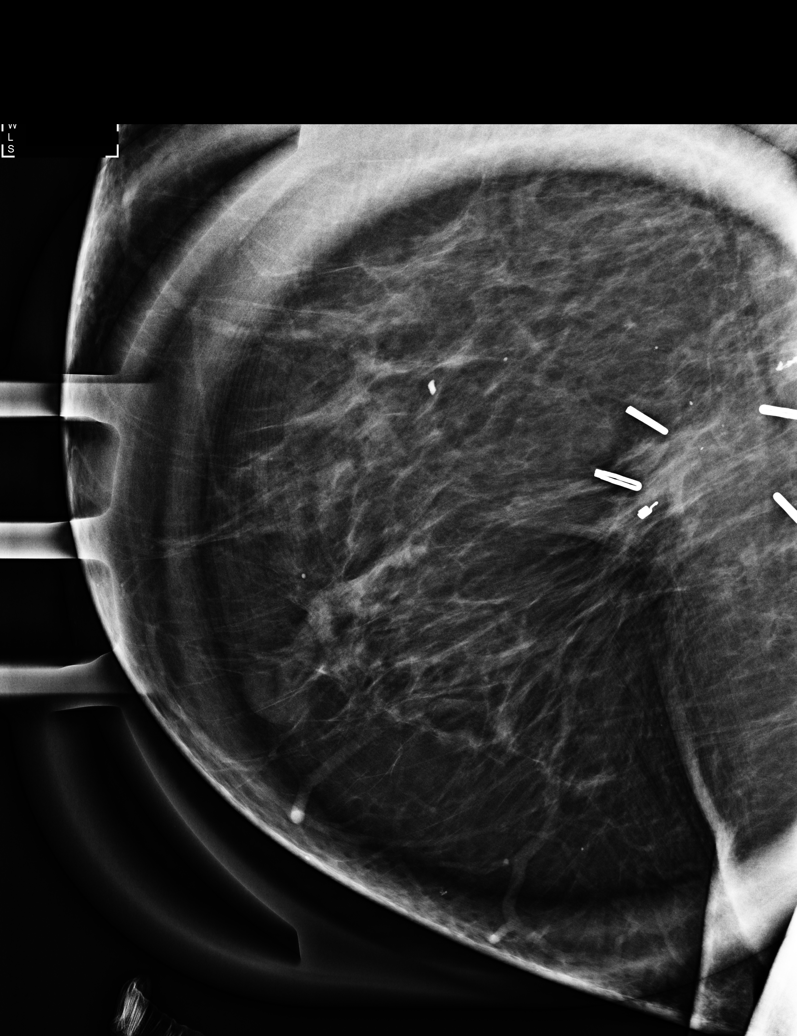

[R MLO synth-2D]
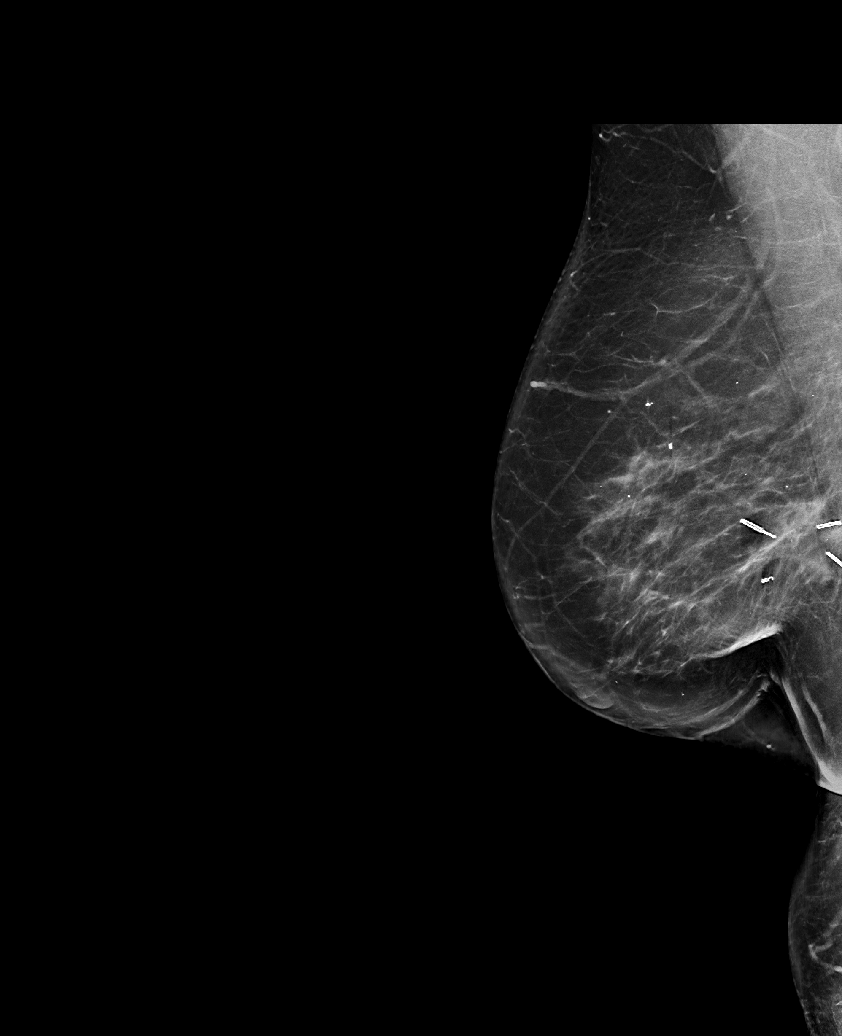

[L CC synth-2D]
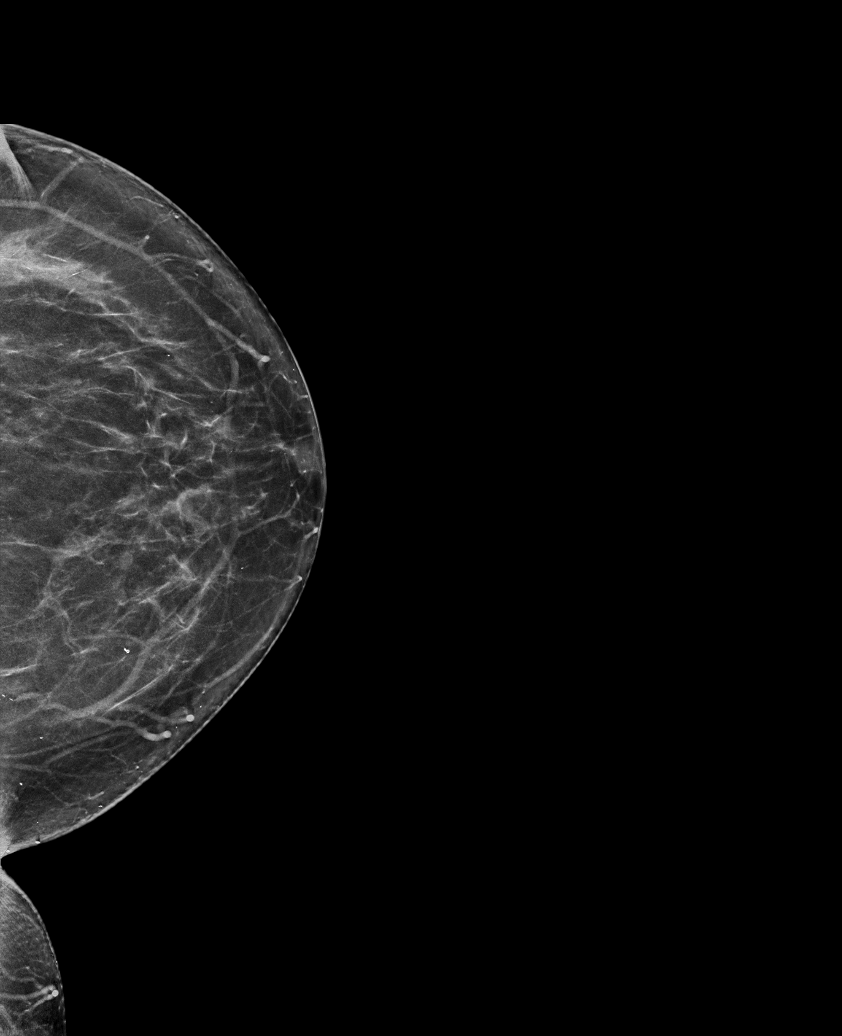

[L MLO synth-2D]
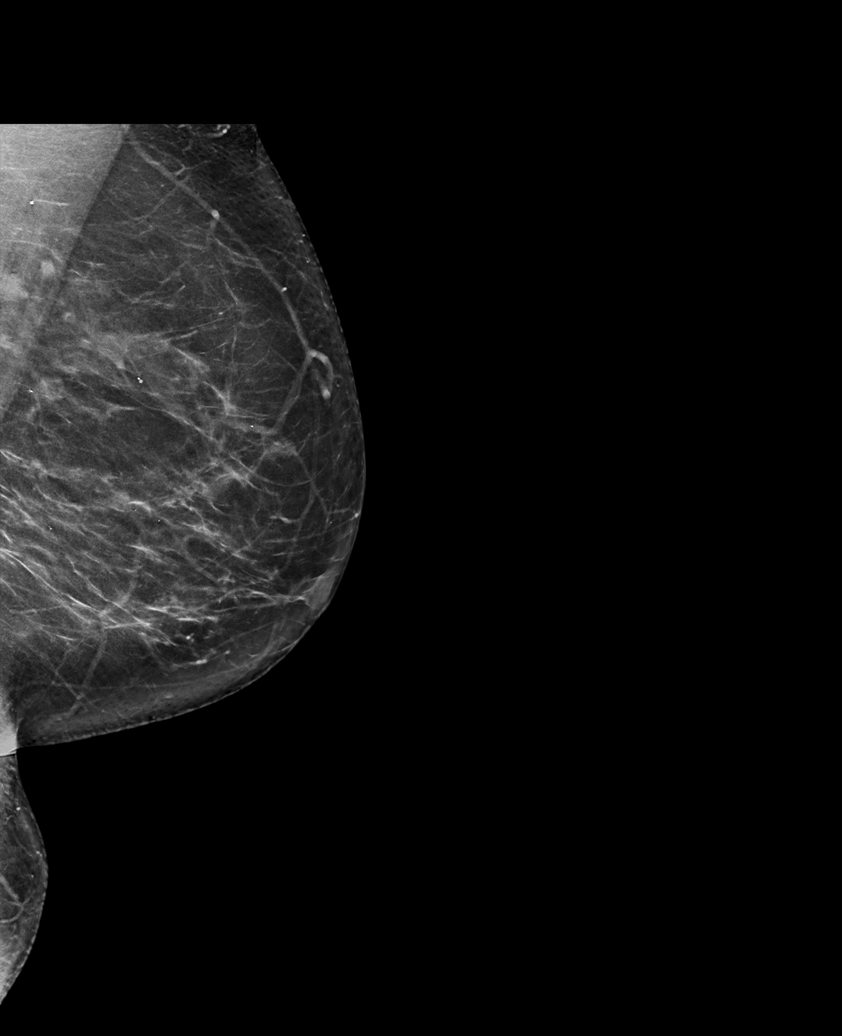

[R CC synth-2D]
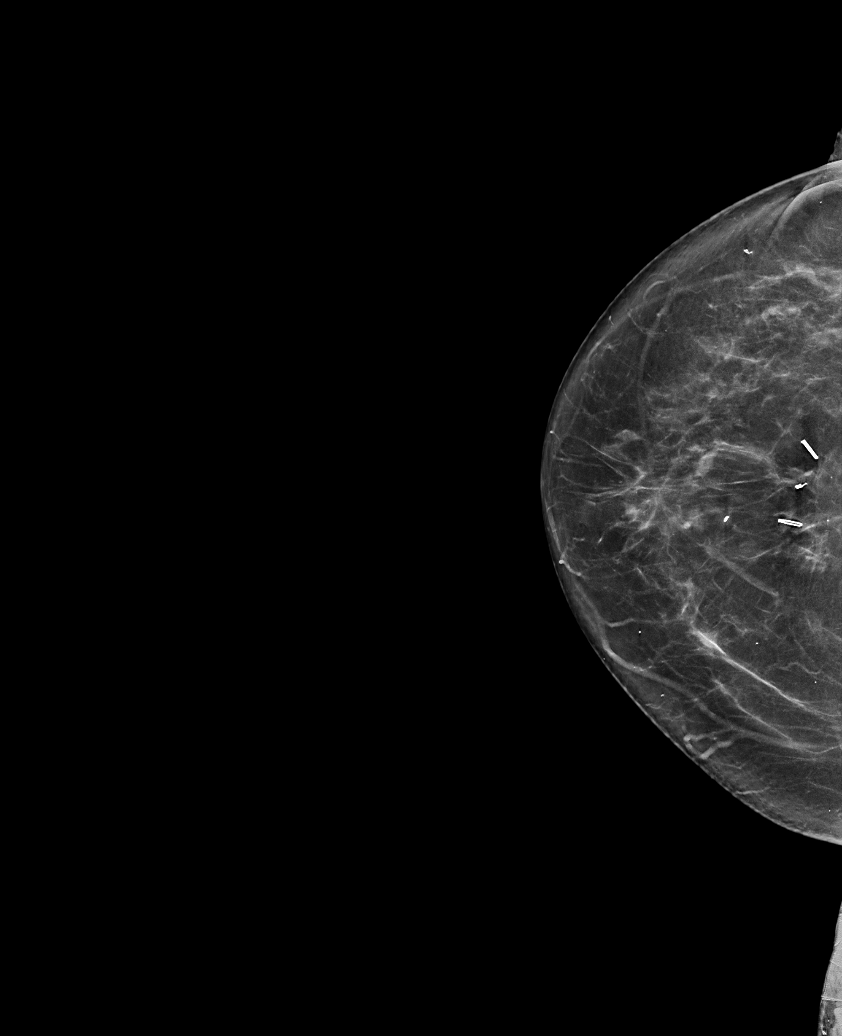

[L MLO tomo · tomo slice 38/75.0]
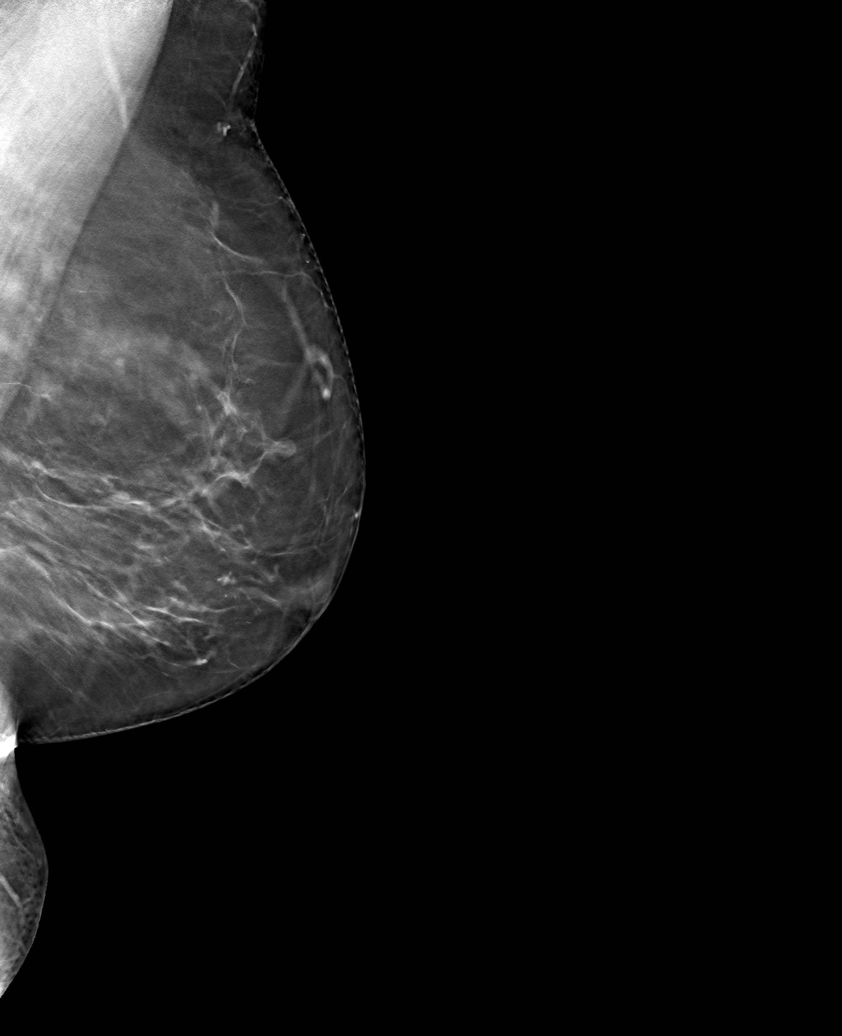

[6 of 25 positions shown; findings below may reference images not displayed]

ACR Breast Density Category b: There are scattered areas of
fibroglandular density.
FINDINGS: 2D and 3D full field views of both breasts and a magnification view
of the lumpectomy site demonstrate no suspicious mass, nonsurgical
distortion or worrisome calcifications.

RIGHT lumpectomy changes and COIL biopsy clip again noted.

Mammographic images were processed with CAD.
IMPRESSION: 1. No mammographic evidence of breast malignancy.

RECOMMENDATION:
Bilateral diagnostic mammogram in 1 year

I have discussed the findings and recommendations with the patient.
If applicable, a reminder letter will be sent to the patient
regarding the next appointment.

BI-RADS CATEGORY  2: Benign.

## 2020-08-08 ENCOUNTER — Other Ambulatory Visit (HOSPITAL_COMMUNITY): Payer: Self-pay

## 2020-08-08 MED FILL — Hydrochlorothiazide Tab 25 MG: ORAL | 90 days supply | Qty: 90 | Fill #0 | Status: AC

## 2020-08-08 MED FILL — Cyanocobalamin Inj 1000 MCG/ML: INTRAMUSCULAR | 30 days supply | Qty: 1 | Fill #1 | Status: AC

## 2020-08-08 MED FILL — Levothyroxine Sodium Tab 100 MCG: ORAL | 90 days supply | Qty: 90 | Fill #0 | Status: AC

## 2020-08-16 ENCOUNTER — Other Ambulatory Visit (HOSPITAL_COMMUNITY): Payer: Self-pay

## 2020-08-23 DIAGNOSIS — M25561 Pain in right knee: Secondary | ICD-10-CM | POA: Diagnosis not present

## 2020-08-30 ENCOUNTER — Other Ambulatory Visit (HOSPITAL_COMMUNITY): Payer: Self-pay

## 2020-09-06 ENCOUNTER — Other Ambulatory Visit (HOSPITAL_COMMUNITY): Payer: Self-pay

## 2020-09-06 MED FILL — Cyanocobalamin Inj 1000 MCG/ML: INTRAMUSCULAR | 30 days supply | Qty: 1 | Fill #2 | Status: AC

## 2020-09-28 DIAGNOSIS — M1711 Unilateral primary osteoarthritis, right knee: Secondary | ICD-10-CM | POA: Diagnosis not present

## 2020-09-28 DIAGNOSIS — M25561 Pain in right knee: Secondary | ICD-10-CM | POA: Diagnosis not present

## 2020-10-07 DIAGNOSIS — M1711 Unilateral primary osteoarthritis, right knee: Secondary | ICD-10-CM | POA: Diagnosis not present

## 2020-10-14 DIAGNOSIS — M1711 Unilateral primary osteoarthritis, right knee: Secondary | ICD-10-CM | POA: Diagnosis not present

## 2020-10-14 DIAGNOSIS — M25561 Pain in right knee: Secondary | ICD-10-CM | POA: Diagnosis not present

## 2020-10-31 ENCOUNTER — Other Ambulatory Visit: Payer: Self-pay | Admitting: Oncology

## 2020-10-31 DIAGNOSIS — Z9889 Other specified postprocedural states: Secondary | ICD-10-CM

## 2020-11-06 MED FILL — Hydrochlorothiazide Tab 25 MG: ORAL | 90 days supply | Qty: 90 | Fill #1 | Status: AC

## 2020-11-06 MED FILL — Levothyroxine Sodium Tab 100 MCG: ORAL | 90 days supply | Qty: 90 | Fill #1 | Status: AC

## 2020-11-07 ENCOUNTER — Other Ambulatory Visit (HOSPITAL_COMMUNITY): Payer: Self-pay

## 2020-11-07 MED FILL — Cyanocobalamin Inj 1000 MCG/ML: INTRAMUSCULAR | 30 days supply | Qty: 1 | Fill #3 | Status: AC

## 2020-11-18 ENCOUNTER — Other Ambulatory Visit: Payer: Self-pay

## 2020-11-18 ENCOUNTER — Ambulatory Visit
Admission: RE | Admit: 2020-11-18 | Discharge: 2020-11-18 | Disposition: A | Discharge status: Home / Self Care | Payer: 59 | Source: Ambulatory Visit | Attending: Oncology | Admitting: Oncology

## 2020-11-18 ENCOUNTER — Other Ambulatory Visit: Payer: Self-pay | Admitting: Oncology

## 2020-11-18 DIAGNOSIS — Z1231 Encounter for screening mammogram for malignant neoplasm of breast: Secondary | ICD-10-CM | POA: Diagnosis not present

## 2020-11-18 DIAGNOSIS — Z9889 Other specified postprocedural states: Secondary | ICD-10-CM

## 2020-11-18 NOTE — Progress Notes (Signed)
[facility]  [address] [place],    [address] [phone]  Clinic Day:  11/25/2020  Referring physician: Bess Harvest*  This document serves as a record of services personally performed by Hosie Poisson, MD. It was created on their behalf by Curry,Lauren E, a trained medical scribe. The creation of this record is based on the scribe's personal observations and the provider's statements to them.  CHIEF COMPLAINT:  CC: History of stage 0 hormone receptor positive ductal carcinoma in situ  Current Treatment:  Surveillance   HISTORY OF PRESENT ILLNESS:  Charyl Minervini is a 65 y.o. 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  Calcifications were seen on routine mammogram.  She was treated with lumpectomy in September 2016.  Pathology revealed a 0.8 cm, intermediate grade, ductal carcinoma in situ.  Estrogen and progesterone receptors were positive.  She had a positive focal lateral margin, so underwent re-excision that October.  There was residual ductal carcinoma in situ, but the final margin was clear.  She received adjuvant radiation to the right breast, completed in December 2016.  She was placed on chemoprevention with raloxifene 60 mg daily in January 2017.  She has multiple comorbidities including diabetes, hypothyroidism, hypertension, hyperlipidemia, and peptic ulcer disease.  Bilateral diagnostic mammogram in August 2019 did not reveal any evidence of malignancy.  Review of her family history reveals a paternal aunt had breast cancer at age 80, her mother had pancreatic cancer at age 69 and her father was diagnosed with colon cancer at age 46. She completed 5 years of endocrine therapy with raloxifene in January 2022.    INTERVAL HISTORY:  Holliday is here for annual follow up and states that she has been well and denies  complaints. Annual screening bilateral mammogram from September 30th was clear. She undergoes routine blood work with Leeroy Cha. Her  appetite is good, and she has lost 1 1/2 pounds since her last visit.  She denies fever, chills or other signs of infection.  She denies nausea, vomiting, bowel issues, or abdominal pain.  She denies sore throat, cough, dyspnea, or chest pain.  REVIEW OF SYSTEMS:  Review of Systems  Constitutional: Negative.  Negative for appetite change, chills, fatigue, fever and unexpected weight change.  HENT:  Negative.    Eyes: Negative.   Respiratory: Negative.  Negative for chest tightness, cough, hemoptysis, shortness of breath and wheezing.   Cardiovascular: Negative.  Negative for chest pain, leg swelling and palpitations.  Gastrointestinal: Negative.  Negative for abdominal distention, abdominal pain, blood in stool, constipation, diarrhea, nausea and vomiting.  Endocrine: Negative.   Genitourinary: Negative.  Negative for difficulty urinating, dysuria, frequency and hematuria.   Musculoskeletal: Negative.  Negative for arthralgias, back pain, flank pain, gait problem and myalgias.  Skin: Negative.   Neurological: Negative.  Negative for dizziness, extremity weakness, gait problem, headaches, light-headedness, numbness, seizures and speech difficulty.  Hematological: Negative.   Psychiatric/Behavioral: Negative.  Negative for depression and sleep disturbance. The patient is not nervous/anxious.     VITALS:  Blood pressure 129/80, pulse 88, temperature 97.9 F (36.6 C), temperature source Oral, resp. rate 18, height 5\' 3"  (1.6 m), weight 175 lb 11.2 oz (79.7 kg), SpO2 95 %.  Wt Readings from Last 3 Encounters:  11/25/20 175 lb 11.2 oz (79.7 kg)  09/03/19 175 lb 12.8 oz (79.7 kg)  08/06/19 177  lb (80.3 kg)    Body mass index is 31.12 kg/m.  Performance status (ECOG): 0 - Asymptomatic  PHYSICAL EXAM:  Physical Exam Constitutional:      General: She  is not in acute distress.    Appearance: Normal appearance. She is normal weight.  HENT:     Head: Normocephalic and atraumatic.  Eyes:     General: No scleral icterus.    Extraocular Movements: Extraocular movements intact.     Conjunctiva/sclera: Conjunctivae normal.     Pupils: Pupils are equal, round, and reactive to light.  Cardiovascular:     Rate and Rhythm: Normal rate and regular rhythm.     Pulses: Normal pulses.     Heart sounds: Normal heart sounds. No murmur heard.   No friction rub. No gallop.  Pulmonary:     Effort: Pulmonary effort is normal. No respiratory distress.     Breath sounds: Normal breath sounds.  Chest:     Comments: Deep scar in the inferior right breast which is well healed. Both breasts are without masses. Abdominal:     General: Bowel sounds are normal. There is no distension.     Palpations: Abdomen is soft. There is no hepatomegaly, splenomegaly or mass.     Tenderness: There is no abdominal tenderness.  Musculoskeletal:        General: Normal range of motion.     Cervical back: Normal range of motion and neck supple.     Right lower leg: Edema (trace) present.     Left lower leg: Edema (trace) present.  Lymphadenopathy:     Cervical: No cervical adenopathy.  Skin:    General: Skin is warm and dry.  Neurological:     General: No focal deficit present.     Mental Status: She is alert and oriented to person, place, and time. Mental status is at baseline.  Psychiatric:        Mood and Affect: Mood normal.        Behavior: Behavior normal.        Thought Content: Thought content normal.        Judgment: Judgment normal.    LABS:   CBC Latest Ref Rng & Units 06/05/2019  WBC 4.0 - 10.5 K/uL 5.7  Hemoglobin 12.0 - 15.0 g/dL 15.0  Hematocrit 36.0 - 46.0 % 47.4(H)  Platelets 150 - 400 K/uL 272   CMP Latest Ref Rng & Units 06/05/2019  Glucose 70 - 99 mg/dL 100(H)  BUN 8 - 23 mg/dL 23  Creatinine 0.44 - 1.00 mg/dL 0.65  Sodium 135 - 145  mmol/L 139  Potassium 3.5 - 5.1 mmol/L 3.6  Chloride 98 - 111 mmol/L 100  CO2 22 - 32 mmol/L 27  Calcium 8.9 - 10.3 mg/dL 9.6    STUDIES:  MM 3D SCREEN BREAST BILATERAL  Result Date: 11/24/2020 CLINICAL DATA:  Screening. EXAM: DIGITAL SCREENING BILATERAL MAMMOGRAM WITH TOMOSYNTHESIS AND CAD TECHNIQUE: Bilateral screening digital craniocaudal and mediolateral oblique mammograms were obtained. Bilateral screening digital breast tomosynthesis was performed. The images were evaluated with computer-aided detection. COMPARISON:  Previous exam(s). ACR Breast Density Category b: There are scattered areas of fibroglandular density. FINDINGS: There are no findings suspicious for malignancy. IMPRESSION: No mammographic evidence of malignancy. A result letter of this screening mammogram will be mailed directly to the patient. RECOMMENDATION: Screening mammogram in one year. (Code:SM-B-01Y) BI-RADS CATEGORY  1: Negative. Electronically Signed   By: Lajean Manes M.D.   On: 11/24/2020 13:15  HISTORY:   Allergies:  Allergies  Allergen Reactions   Etodolac Rash    Current Medications: Current Outpatient Medications  Medication Sig Dispense Refill   albuterol (PROVENTIL HFA;VENTOLIN HFA) 108 (90 BASE) MCG/ACT inhaler Inhale 1-2 puffs into the lungs every 6 (six) hours as needed for wheezing or shortness of breath.      aspirin 81 MG chewable tablet Chew 81 mg by mouth daily.     celecoxib (CELEBREX) 100 MG capsule Take 100 mg by mouth daily.      Cholecalciferol (VITAMIN D3) 125 MCG (5000 UT) CAPS Take 5,000 Units by mouth daily.     clorazepate (TRANXENE) 3.75 MG tablet TAKE ONE TABLET UP TO THREE TIMES DAILY AS NEEDED 60 tablet 0   cyanocobalamin (,VITAMIN B-12,) 1000 MCG/ML injection INJECT 1 ML INTO THE MUSCLE EVERY 30 DAYS 1 mL 11   hydrochlorothiazide (HYDRODIURIL) 25 MG tablet TAKE 1 TABLET BY MOUTH ONCE DAILY 90 tablet 3   levothyroxine (SYNTHROID) 100 MCG tablet TAKE 1 TABLET BY MOUTH ONCE  DAILY 90 tablet 3   pantoprazole (PROTONIX) 40 MG tablet Take 1 tablet (40 mg total) by mouth daily. 90 tablet 3   promethazine (PHENERGAN) 25 MG tablet Take 25 mg by mouth every 6 (six) hours as needed for nausea or vomiting.     Current Facility-Administered Medications  Medication Dose Route Frequency Provider Last Rate Last Admin   triamcinolone acetonide (KENALOG-40) injection 80 mg  80 mg Intra-articular Once CoxElnita Maxwell, MD         ASSESSMENT & PLAN:   Assessment/Plan:  Luceil Herrin is a 65 y.o. female with ductal carcinoma in situ of the breast diagnosed in August 2016.  She remains without evidence of recurrence. She completed 5 years of chemoprevention with raloxifene in January 2022. We will plan to see her back in 1 year with bilateral mammogram for reevaluation.  The patient understands the plans discussed today and is in agreement with them.  She knows to contact our office if she develops concerns regarding her breast cancer or its treatment.   I provided 15 minutes of face-to-face time during this this encounter and > 50% was spent counseling as documented under my assessment and plan.    Derwood Kaplan, MD [facility] [address] [place] [address] Dept: [phone] Dept Fax: [phone]   I, Rita Ohara, am acting as scribe for Derwood Kaplan, MD  I have reviewed this report as typed by the medical scribe, and it is complete and accurate.  Hermina Barters

## 2020-11-25 ENCOUNTER — Telehealth: Payer: Self-pay | Admitting: Oncology

## 2020-11-25 ENCOUNTER — Other Ambulatory Visit: Payer: Self-pay | Admitting: Oncology

## 2020-11-25 ENCOUNTER — Inpatient Hospital Stay: Payer: 59 | Attending: Oncology | Admitting: Oncology

## 2020-11-25 ENCOUNTER — Encounter: Payer: Self-pay | Admitting: Oncology

## 2020-11-25 VITALS — BP 129/80 | HR 88 | Temp 97.9°F | Resp 18 | Ht 63.0 in | Wt 175.7 lb

## 2020-11-25 DIAGNOSIS — D0511 Intraductal carcinoma in situ of right breast: Secondary | ICD-10-CM

## 2020-11-25 DIAGNOSIS — Z853 Personal history of malignant neoplasm of breast: Secondary | ICD-10-CM

## 2020-11-25 NOTE — Telephone Encounter (Signed)
Per 11/25/20 LOS next appt scheduled and given to patient

## 2020-11-30 DIAGNOSIS — M25561 Pain in right knee: Secondary | ICD-10-CM | POA: Diagnosis not present

## 2020-12-01 ENCOUNTER — Other Ambulatory Visit (HOSPITAL_COMMUNITY): Payer: Self-pay

## 2020-12-01 MED FILL — Cyanocobalamin Inj 1000 MCG/ML: INTRAMUSCULAR | 30 days supply | Qty: 1 | Fill #4 | Status: AC

## 2020-12-07 ENCOUNTER — Other Ambulatory Visit (HOSPITAL_COMMUNITY): Payer: Self-pay

## 2020-12-07 MED ORDER — CLORAZEPATE DIPOTASSIUM 3.75 MG PO TABS
ORAL_TABLET | ORAL | 0 refills | Status: DC
  Filled 2020-12-07 (×2): qty 60, 20d supply, fill #0

## 2020-12-07 MED ORDER — CELECOXIB 100 MG PO CAPS
100.0000 mg | ORAL_CAPSULE | Freq: Every day | ORAL | 0 refills | Status: DC
  Filled 2020-12-07: qty 90, 90d supply, fill #0

## 2020-12-08 ENCOUNTER — Other Ambulatory Visit (HOSPITAL_COMMUNITY): Payer: Self-pay

## 2020-12-09 ENCOUNTER — Other Ambulatory Visit (HOSPITAL_COMMUNITY): Payer: Self-pay

## 2020-12-13 ENCOUNTER — Ambulatory Visit (INDEPENDENT_AMBULATORY_CARE_PROVIDER_SITE_OTHER): Payer: 59

## 2020-12-13 DIAGNOSIS — Z23 Encounter for immunization: Secondary | ICD-10-CM

## 2021-01-09 DIAGNOSIS — R7303 Prediabetes: Secondary | ICD-10-CM | POA: Diagnosis not present

## 2021-01-09 DIAGNOSIS — R5383 Other fatigue: Secondary | ICD-10-CM | POA: Diagnosis not present

## 2021-01-09 DIAGNOSIS — R5381 Other malaise: Secondary | ICD-10-CM | POA: Diagnosis not present

## 2021-01-09 DIAGNOSIS — I10 Essential (primary) hypertension: Secondary | ICD-10-CM | POA: Diagnosis not present

## 2021-01-09 DIAGNOSIS — R300 Dysuria: Secondary | ICD-10-CM | POA: Diagnosis not present

## 2021-01-09 DIAGNOSIS — K219 Gastro-esophageal reflux disease without esophagitis: Secondary | ICD-10-CM | POA: Diagnosis not present

## 2021-01-09 DIAGNOSIS — E782 Mixed hyperlipidemia: Secondary | ICD-10-CM | POA: Diagnosis not present

## 2021-01-09 DIAGNOSIS — I2510 Atherosclerotic heart disease of native coronary artery without angina pectoris: Secondary | ICD-10-CM | POA: Diagnosis not present

## 2021-01-09 DIAGNOSIS — Z0000 Encounter for general adult medical examination without abnormal findings: Secondary | ICD-10-CM | POA: Diagnosis not present

## 2021-01-09 DIAGNOSIS — Z853 Personal history of malignant neoplasm of breast: Secondary | ICD-10-CM | POA: Diagnosis not present

## 2021-01-09 DIAGNOSIS — H8103 Meniere's disease, bilateral: Secondary | ICD-10-CM | POA: Diagnosis not present

## 2021-01-09 DIAGNOSIS — I5031 Acute diastolic (congestive) heart failure: Secondary | ICD-10-CM | POA: Diagnosis not present

## 2021-01-09 DIAGNOSIS — E039 Hypothyroidism, unspecified: Secondary | ICD-10-CM | POA: Diagnosis not present

## 2021-01-10 DIAGNOSIS — H25813 Combined forms of age-related cataract, bilateral: Secondary | ICD-10-CM | POA: Diagnosis not present

## 2021-01-10 DIAGNOSIS — H43813 Vitreous degeneration, bilateral: Secondary | ICD-10-CM | POA: Diagnosis not present

## 2021-01-10 DIAGNOSIS — E119 Type 2 diabetes mellitus without complications: Secondary | ICD-10-CM | POA: Diagnosis not present

## 2021-01-10 DIAGNOSIS — H524 Presbyopia: Secondary | ICD-10-CM | POA: Diagnosis not present

## 2021-02-07 ENCOUNTER — Other Ambulatory Visit (HOSPITAL_COMMUNITY): Payer: Self-pay

## 2021-02-10 ENCOUNTER — Other Ambulatory Visit (HOSPITAL_COMMUNITY): Payer: Self-pay

## 2021-02-15 ENCOUNTER — Other Ambulatory Visit (HOSPITAL_COMMUNITY): Payer: Self-pay

## 2021-02-16 ENCOUNTER — Other Ambulatory Visit (HOSPITAL_COMMUNITY): Payer: Self-pay

## 2021-02-16 ENCOUNTER — Ambulatory Visit (INDEPENDENT_AMBULATORY_CARE_PROVIDER_SITE_OTHER): Payer: 59

## 2021-02-16 DIAGNOSIS — J060 Acute laryngopharyngitis: Secondary | ICD-10-CM | POA: Diagnosis not present

## 2021-02-16 LAB — POC COVID19 BINAXNOW: SARS Coronavirus 2 Ag: NEGATIVE

## 2021-02-16 LAB — POCT INFLUENZA A/B
Influenza A, POC: NEGATIVE
Influenza B, POC: NEGATIVE

## 2021-02-16 MED ORDER — CYANOCOBALAMIN 1000 MCG/ML IJ SOLN
INTRAMUSCULAR | 3 refills | Status: DC
  Filled 2021-02-16: qty 3, 90d supply, fill #0
  Filled 2021-05-15: qty 3, 90d supply, fill #1
  Filled 2021-09-07: qty 3, 90d supply, fill #2
  Filled 2022-02-05: qty 3, 90d supply, fill #3

## 2021-02-16 MED ORDER — LEVOTHYROXINE SODIUM 100 MCG PO TABS
100.0000 ug | ORAL_TABLET | Freq: Every day | ORAL | 3 refills | Status: DC
  Filled 2021-02-16: qty 90, 90d supply, fill #0
  Filled 2021-05-15: qty 90, 90d supply, fill #1
  Filled 2021-08-15: qty 90, 90d supply, fill #2
  Filled 2021-11-13: qty 90, 90d supply, fill #3

## 2021-02-16 MED ORDER — HYDROCHLOROTHIAZIDE 25 MG PO TABS
25.0000 mg | ORAL_TABLET | Freq: Every day | ORAL | 3 refills | Status: DC
  Filled 2021-02-16: qty 90, 90d supply, fill #0
  Filled 2021-05-15: qty 90, 90d supply, fill #1
  Filled 2021-08-15: qty 90, 90d supply, fill #2
  Filled 2021-11-13: qty 90, 90d supply, fill #3

## 2021-02-16 NOTE — Progress Notes (Signed)
Patient came in today to be tested for COVID and flu, Patient start having diarrhea, cough, congestion yesterday and is still not feeling good.  Provider was made aware of results.

## 2021-02-28 ENCOUNTER — Other Ambulatory Visit: Payer: Self-pay | Admitting: Family Medicine

## 2021-02-28 ENCOUNTER — Ambulatory Visit (INDEPENDENT_AMBULATORY_CARE_PROVIDER_SITE_OTHER): Payer: 59 | Admitting: Family Medicine

## 2021-02-28 VITALS — HR 122 | Resp 16

## 2021-02-28 DIAGNOSIS — J069 Acute upper respiratory infection, unspecified: Secondary | ICD-10-CM

## 2021-02-28 DIAGNOSIS — H65192 Other acute nonsuppurative otitis media, left ear: Secondary | ICD-10-CM | POA: Diagnosis not present

## 2021-02-28 DIAGNOSIS — U071 COVID-19: Secondary | ICD-10-CM

## 2021-02-28 DIAGNOSIS — J4520 Mild intermittent asthma, uncomplicated: Secondary | ICD-10-CM

## 2021-02-28 MED ORDER — AMOXICILLIN 875 MG PO TABS: 875.0000 mg | ORAL_TABLET | Freq: Two times a day (BID) | ORAL | 0 refills | Status: AC

## 2021-02-28 MED ORDER — NIRMATRELVIR/RITONAVIR (PAXLOVID)TABLET: 3.0000 | ORAL_TABLET | Freq: Two times a day (BID) | ORAL | 0 refills | Status: AC

## 2021-03-05 ENCOUNTER — Encounter: Payer: Self-pay | Admitting: Family Medicine

## 2021-03-05 DIAGNOSIS — J069 Acute upper respiratory infection, unspecified: Secondary | ICD-10-CM | POA: Insufficient documentation

## 2021-03-05 DIAGNOSIS — H6690 Otitis media, unspecified, unspecified ear: Secondary | ICD-10-CM | POA: Insufficient documentation

## 2021-03-05 DIAGNOSIS — U071 COVID-19: Secondary | ICD-10-CM | POA: Insufficient documentation

## 2021-03-05 LAB — POC COVID19 BINAXNOW: SARS Coronavirus 2 Ag: POSITIVE — AB

## 2021-03-05 LAB — POCT INFLUENZA A/B
Influenza A, POC: NEGATIVE
Influenza B, POC: NEGATIVE

## 2021-03-05 NOTE — Assessment & Plan Note (Signed)
Rx amoxicillin sent.  

## 2021-03-05 NOTE — Assessment & Plan Note (Signed)
Rx: paxlovid Your COVID test is positive. You should remain isolated and quarantine for at least 5 days from start of symptoms. You must be feeling better and be fever free without any fever reducers for at least 24 hours as well. You should wear a mask at all times when out of your home or around others for 5 days after leaving isolation.  Your household contacts should be tested as well as work contacts. If you feel worse or have increasing shortness of breath, you should be seen in person at urgent care or the emergency room.  Rests, fluids, 3 meals per day.  otc cough medicines.

## 2021-03-05 NOTE — Assessment & Plan Note (Signed)
Pt to call if worsening and feels she needs prednisone.

## 2021-03-05 NOTE — Progress Notes (Signed)
Acute Office Visit  Subjective:    Patient ID: Amanda Haas, female    DOB: [birth year], 66 y.o.   MRN: [id number]  Chief Complaint  Patient presents with   Nasal Congestion    HPI: Patient is in today for nasal congestion, fatigue, chills, achyness which began while working today. Pt is a nurse in our office. She tested positive for covid 19 and negative for influenza.   Past Medical History:  Diagnosis Date   Arthritis    knees   Breast cancer ([place]) 2018   Right Breast Cancer   Encounter for long-term (current) use of high-risk medication 10/19/2015   Essential hypertension 10/19/2015   Gastroesophageal reflux disease 10/19/2015   GERD (gastroesophageal reflux disease)    History of breast cancer 10/19/2015   Formatting of this note might be different from the original. 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 Follows with Dr. Hinton Rao, diagnosed in 2016 started her tamoxifen and 2017 for a total of 5 years   Hypertension    Hypothyroidism    Hypothyroidism (acquired) 10/19/2015   Malaise and fatigue 06/18/2018   Meniere's disease 10/19/2015   Mild intermittent asthma 10/19/2015   Pain in right knee 09/18/2018   Personal history of radiation therapy 2018   Right Breast Cancer   PONV (postoperative nausea and vomiting)    Seasonal allergies    Vitamin B12 deficiency 12/17/2018   Vitamin D deficiency 10/19/2015    Past Surgical History:  Procedure Laterality Date   APPENDECTOMY     BREAST BIOPSY     BREAST EXCISIONAL BIOPSY     BREAST LUMPECTOMY Right 2018   BREAST LUMPECTOMY WITH RADIOACTIVE SEED LOCALIZATION Right 11/17/2014   Procedure: RIGHT RADIOACTIVE SEED LUMPECTOMY;  Surgeon: Donnie Mesa, MD;  Location: [facility];  Service: General;  Laterality: Right;   BREAST SURGERY Left    DILATION AND CURETTAGE OF UTERUS     KNEE ARTHROSCOPY Left     RE-EXCISION OF BREAST LUMPECTOMY Right 11/30/2014   Procedure: RE-EXCISION OF RIGHT BREAST LUMPECTOMY;  Surgeon: Donnie Mesa, MD;  Location: [facility];  Service: General;  Laterality: Right;    Family History  Problem Relation Age of Onset   Pancreatic cancer Mother    Heart disease Father    Colon cancer Father    Atrial fibrillation Father    Hypertension Sister    Hyperthyroidism Brother    Stroke Maternal Grandmother    Diabetes Maternal Grandfather    Stroke Paternal Grandmother    Heart disease Paternal Grandfather     Social History   Socioeconomic History   Marital status: Married    Spouse name: Not on file   Number of children: Not on file   Years of education: Not on file   Highest education level: Not on file  Occupational History   Not on file  Tobacco Use   Smoking status: Never   Smokeless tobacco: Never  Substance and Sexual Activity   Alcohol use: No   Drug use: No   Sexual activity: Not on file  Other Topics Concern   Not on file  Social History Narrative   Not on file   Social Determinants of Health   Financial Resource Strain: Not on file  Food Insecurity: Not on file  Transportation Needs: Not on file  Physical Activity: Not on file  Stress: Not  on file  Social Connections: Not on file  Intimate Partner Violence: Not on file    Outpatient Medications Prior to Visit  Medication Sig Dispense Refill   albuterol (PROVENTIL HFA;VENTOLIN HFA) 108 (90 BASE) MCG/ACT inhaler Inhale 1-2 puffs into the lungs every 6 (six) hours as needed for wheezing or shortness of breath.      amoxicillin (AMOXIL) 875 MG tablet Take 1 tablet (875 mg total) by mouth 2 (two) times daily for 10 days. 20 tablet 0   aspirin 81 MG chewable tablet Chew 81 mg by mouth daily.     celecoxib (CELEBREX) 100 MG capsule Take 100 mg by mouth daily.      celecoxib (CELEBREX) 100 MG capsule Take 1 capsule (100 mg total) by mouth daily. 90 capsule 0    Cholecalciferol (VITAMIN D3) 125 MCG (5000 UT) CAPS Take 5,000 Units by mouth daily.     clorazepate (TRANXENE) 3.75 MG tablet TAKE ONE TABLET UP TO THREE TIMES DAILY AS NEEDED 60 tablet 0   clorazepate (TRANXENE) 3.75 MG tablet TAKE ONE TABLET UP TO THREE TIMES DAILY AS NEEDED 60 tablet 0   cyanocobalamin (,VITAMIN B-12,) 1000 MCG/ML injection Inject 1 mL (1,000 mcg total) into the muscle every 30 days. 3 mL 3   hydrochlorothiazide (HYDRODIURIL) 25 MG tablet TAKE 1 TABLET BY MOUTH ONCE DAILY 90 tablet 3   levothyroxine (SYNTHROID) 100 MCG tablet TAKE 1 TABLET BY MOUTH ONCE DAILY 90 tablet 3   nirmatrelvir/ritonavir EUA (PAXLOVID) 20 x 150 MG & 10 x 100MG  TABS Take 3 tablets by mouth 2 (two) times daily for 5 days. (Take nirmatrelvir 150 mg two tablets twice daily for 5 days and ritonavir 100 mg one tablet twice daily for 5 days) Patient GFR is >90 30 tablet 0   pantoprazole (PROTONIX) 40 MG tablet Take 1 tablet (40 mg total) by mouth daily. 90 tablet 3   promethazine (PHENERGAN) 25 MG tablet Take 25 mg by mouth every 6 (six) hours as needed for nausea or vomiting.     Facility-Administered Medications Prior to Visit  Medication Dose Route Frequency Provider Last Rate Last Admin   triamcinolone acetonide (KENALOG-40) injection 80 mg  80 mg Intra-articular Once Hazell Siwik, MD        Allergies  Allergen Reactions   Etodolac Rash    Review of Systems  Constitutional:  Positive for chills and fatigue. Negative for fever.  HENT:  Positive for congestion and rhinorrhea. Negative for ear pain and sore throat.   Respiratory:  Positive for cough. Negative for shortness of breath.   Cardiovascular:  Negative for chest pain.  Musculoskeletal:  Positive for myalgias.  Neurological:  Positive for headaches.      Objective:    Physical Exam Vitals reviewed.  Constitutional:      Appearance: Normal appearance. She is normal weight.  HENT:     Right Ear: Tympanic membrane, ear canal and  external ear normal.     Ears:     Comments: Left tm erythema.    Nose: Congestion and rhinorrhea present.     Mouth/Throat:     Pharynx: Oropharynx is clear. No oropharyngeal exudate or posterior oropharyngeal erythema.  Cardiovascular:     Rate and Rhythm: Normal rate and regular rhythm.     Heart sounds: Normal heart sounds. No murmur heard. Pulmonary:     Effort: Pulmonary effort is normal. No respiratory distress.     Breath sounds: Normal breath sounds.  Neurological:  Mental Status: She is alert and oriented to person, place, and time.  Psychiatric:        Mood and Affect: Mood normal.        Behavior: Behavior normal.    Pulse (!) 122    Resp 16    SpO2 95%  Wt Readings from Last 3 Encounters:  11/25/20 175 lb 11.2 oz (79.7 kg)  09/03/19 175 lb 12.8 oz (79.7 kg)  08/06/19 177 lb (80.3 kg)    Health Maintenance Due  Topic Date Due   HEMOGLOBIN A1C  Never done   COVID-19 Vaccine (1) Never done   Pneumococcal Vaccine 49-54 Years old (1 - PCV) Never done   FOOT EXAM  Never done   URINE MICROALBUMIN  Never done   HIV Screening  Never done   Hepatitis C Screening  Never done   Zoster Vaccines- Shingrix (1 of 2) Never done   PAP SMEAR-Modifier  Never done   COLONOSCOPY (Pts 45-44yrs Insurance coverage will need to be confirmed)  Never done   OPHTHALMOLOGY EXAM  12/13/2020    There are no preventive care reminders to display for this patient.   No results found for: TSH Lab Results  Component Value Date   WBC 5.7 06/05/2019   HGB 15.0 06/05/2019   HCT 47.4 (H) 06/05/2019   MCV 87.6 06/05/2019   PLT 272 06/05/2019   Lab Results  Component Value Date   NA 139 06/05/2019   K 3.6 06/05/2019   CO2 27 06/05/2019   GLUCOSE 100 (H) 06/05/2019   BUN 23 06/05/2019   CREATININE 0.65 06/05/2019   CALCIUM 9.6 06/05/2019   ANIONGAP 12 06/05/2019   No results found for: CHOL No results found for: HDL No results found for: LDLCALC No results found for: TRIG No  results found for: CHOLHDL No results found for: HGBA1C     Assessment & Plan:   Problem List Items Addressed This Visit       Respiratory   Mild intermittent asthma without complication    Pt to call if worsening and feels she needs prednisone.       Upper respiratory tract infection due to COVID-19 virus    Rx: paxlovid Your COVID test is positive. You should remain isolated and quarantine for at least 5 days from start of symptoms. You must be feeling better and be fever free without any fever reducers for at least 24 hours as well. You should wear a mask at all times when out of your home or around others for 5 days after leaving isolation.  Your household contacts should be tested as well as work contacts. If you feel worse or have increasing shortness of breath, you should be seen in person at urgent care or the emergency room.  Rests, fluids, 3 meals per day.  otc cough medicines.        Relevant Orders   POCT Influenza A/B (Completed)   POC COVID-19 BinaxNow (Completed)     Nervous and Auditory   Otitis media - Primary    Rx: amoxicillin sent.         Follow-up: Return if symptoms worsen or fail to improve.  An After Visit Summary was printed and given to the patient.  Rochel Brome, MD [facility] [phone]

## 2021-03-10 ENCOUNTER — Other Ambulatory Visit (HOSPITAL_COMMUNITY): Payer: Self-pay

## 2021-03-10 MED ORDER — CELECOXIB 100 MG PO CAPS
100.0000 mg | ORAL_CAPSULE | Freq: Every day | ORAL | 0 refills | Status: DC
  Filled 2021-03-10: qty 90, 90d supply, fill #0

## 2021-05-15 ENCOUNTER — Other Ambulatory Visit (HOSPITAL_COMMUNITY): Payer: Self-pay

## 2021-06-09 ENCOUNTER — Other Ambulatory Visit (HOSPITAL_COMMUNITY): Payer: Self-pay

## 2021-06-09 MED ORDER — CELECOXIB 100 MG PO CAPS
100.0000 mg | ORAL_CAPSULE | Freq: Every day | ORAL | 0 refills | Status: DC
  Filled 2021-06-09: qty 90, 90d supply, fill #0

## 2021-07-12 ENCOUNTER — Other Ambulatory Visit (HOSPITAL_COMMUNITY): Payer: Self-pay

## 2021-07-12 DIAGNOSIS — E782 Mixed hyperlipidemia: Secondary | ICD-10-CM | POA: Diagnosis not present

## 2021-07-12 DIAGNOSIS — I10 Essential (primary) hypertension: Secondary | ICD-10-CM | POA: Diagnosis not present

## 2021-07-12 DIAGNOSIS — Z7982 Long term (current) use of aspirin: Secondary | ICD-10-CM | POA: Diagnosis not present

## 2021-07-12 DIAGNOSIS — R5383 Other fatigue: Secondary | ICD-10-CM | POA: Diagnosis not present

## 2021-07-12 DIAGNOSIS — Z853 Personal history of malignant neoplasm of breast: Secondary | ICD-10-CM | POA: Diagnosis not present

## 2021-07-12 DIAGNOSIS — R7303 Prediabetes: Secondary | ICD-10-CM | POA: Diagnosis not present

## 2021-07-12 DIAGNOSIS — H8109 Meniere's disease, unspecified ear: Secondary | ICD-10-CM | POA: Diagnosis not present

## 2021-07-12 DIAGNOSIS — R5381 Other malaise: Secondary | ICD-10-CM | POA: Diagnosis not present

## 2021-07-12 DIAGNOSIS — K219 Gastro-esophageal reflux disease without esophagitis: Secondary | ICD-10-CM | POA: Diagnosis not present

## 2021-07-12 DIAGNOSIS — R002 Palpitations: Secondary | ICD-10-CM | POA: Diagnosis not present

## 2021-07-12 DIAGNOSIS — I2510 Atherosclerotic heart disease of native coronary artery without angina pectoris: Secondary | ICD-10-CM | POA: Diagnosis not present

## 2021-07-12 MED ORDER — CLORAZEPATE DIPOTASSIUM 3.75 MG PO TABS
ORAL_TABLET | ORAL | 5 refills | Status: DC
  Filled 2021-07-12 (×2): qty 60, 20d supply, fill #0

## 2021-07-28 ENCOUNTER — Other Ambulatory Visit (HOSPITAL_COMMUNITY): Payer: Self-pay

## 2021-07-28 MED ORDER — ALBUTEROL SULFATE HFA 108 (90 BASE) MCG/ACT IN AERS
INHALATION_SPRAY | RESPIRATORY_TRACT | 11 refills | Status: DC
  Filled 2021-07-28: qty 18, 25d supply, fill #0

## 2021-08-15 ENCOUNTER — Encounter (HOSPITAL_COMMUNITY): Payer: Self-pay | Admitting: Pharmacist

## 2021-08-15 ENCOUNTER — Other Ambulatory Visit (HOSPITAL_COMMUNITY): Payer: Self-pay

## 2021-08-16 ENCOUNTER — Other Ambulatory Visit (HOSPITAL_COMMUNITY): Payer: Self-pay

## 2021-09-07 ENCOUNTER — Other Ambulatory Visit (HOSPITAL_COMMUNITY): Payer: Self-pay

## 2021-09-07 MED ORDER — CELECOXIB 100 MG PO CAPS
100.0000 mg | ORAL_CAPSULE | Freq: Every day | ORAL | 0 refills | Status: DC
  Filled 2021-09-07: qty 90, 90d supply, fill #0

## 2021-09-07 MED ORDER — PANTOPRAZOLE SODIUM 40 MG PO TBEC
40.0000 mg | DELAYED_RELEASE_TABLET | Freq: Every day | ORAL | 0 refills | Status: DC
  Filled 2021-09-07: qty 90, 90d supply, fill #0

## 2021-09-08 ENCOUNTER — Other Ambulatory Visit (HOSPITAL_COMMUNITY): Payer: Self-pay

## 2021-10-11 ENCOUNTER — Other Ambulatory Visit: Payer: Self-pay | Admitting: Oncology

## 2021-10-11 DIAGNOSIS — Z1231 Encounter for screening mammogram for malignant neoplasm of breast: Secondary | ICD-10-CM

## 2021-11-13 ENCOUNTER — Other Ambulatory Visit (HOSPITAL_COMMUNITY): Payer: Self-pay

## 2021-11-21 ENCOUNTER — Ambulatory Visit
Admission: RE | Admit: 2021-11-21 | Discharge: 2021-11-21 | Disposition: A | Discharge status: Home / Self Care | Payer: 59 | Source: Ambulatory Visit | Attending: Oncology | Admitting: Oncology

## 2021-11-21 DIAGNOSIS — Z1231 Encounter for screening mammogram for malignant neoplasm of breast: Secondary | ICD-10-CM | POA: Diagnosis not present

## 2021-11-22 ENCOUNTER — Other Ambulatory Visit: Payer: Self-pay | Admitting: Oncology

## 2021-11-22 DIAGNOSIS — R928 Other abnormal and inconclusive findings on diagnostic imaging of breast: Secondary | ICD-10-CM

## 2021-11-23 ENCOUNTER — Ambulatory Visit (INDEPENDENT_AMBULATORY_CARE_PROVIDER_SITE_OTHER): Payer: 59 | Admitting: Family Medicine

## 2021-11-23 DIAGNOSIS — U071 COVID-19: Secondary | ICD-10-CM | POA: Diagnosis not present

## 2021-11-23 DIAGNOSIS — J069 Acute upper respiratory infection, unspecified: Secondary | ICD-10-CM

## 2021-11-23 MED ORDER — NIRMATRELVIR/RITONAVIR (PAXLOVID)TABLET: 3.0000 | ORAL_TABLET | Freq: Two times a day (BID) | ORAL | 0 refills | Status: AC

## 2021-11-23 MED ORDER — NIRMATRELVIR/RITONAVIR (PAXLOVID)TABLET: 3.0000 | ORAL_TABLET | Freq: Two times a day (BID) | ORAL | 0 refills | Status: DC

## 2021-11-23 NOTE — Progress Notes (Signed)
Virtual Visit via Telephone Note   This visit type was conducted due to national recommendations for restrictions regarding the COVID-19 Pandemic (e.g. social distancing) in an effort to limit this patient's exposure and mitigate transmission in our community.  Due to her co-morbid illnesses, this patient is at least at moderate risk for complications without adequate follow up.  This format is felt to be most appropriate for this patient at this time.  The patient did not have access to video technology/had technical difficulties with video requiring transitioning to audio format only (telephone).  All issues noted in this document were discussed and addressed.  No physical exam could be performed with this format.  Patient verbally consented to a telehealth visit.   Date:  11/26/2021   ID:  Amanda Haas, DOB 1956/01/07, MRN [id number]  Patient Location: Home Provider Location: Office/Clinic  PCP:  Mayer Camel, NP   Evaluation Performed: acute  Chief Complaint:  cough  History of Present Illness:    Amanda Haas is a 66 y.o. female with chills, sweats, muscle pain, coughing, shortness of breath times less than 24 hours.  COVID test was positive this morning.  Denies fever.  The patient does have symptoms concerning for COVID-19 infection (fever, chills, cough, or new shortness of breath).    Past Medical History:  Diagnosis Date   Arthritis    knees   Breast cancer ([facility]) 2018   Right Breast Cancer   Encounter for long-term (current) use of high-risk medication 10/19/2015   Essential hypertension 10/19/2015   Gastroesophageal reflux disease 10/19/2015   GERD (gastroesophageal reflux disease)    History of breast cancer 10/19/2015   Formatting of this note might be different from the original. 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 Follows  with Dr. Hinton Rao, diagnosed in 2016 started her tamoxifen and 2017 for a total of 5 years   Hypertension    Hypothyroidism    Hypothyroidism (acquired) 10/19/2015   Malaise and fatigue 06/18/2018   Meniere's disease 10/19/2015   Mild intermittent asthma 10/19/2015   Pain in right knee 09/18/2018   Personal history of radiation therapy 2018   Right Breast Cancer   PONV (postoperative nausea and vomiting)    Seasonal allergies    Vitamin B12 deficiency 12/17/2018   Vitamin D deficiency 10/19/2015    Past Surgical History:  Procedure Laterality Date   APPENDECTOMY     BREAST BIOPSY     BREAST EXCISIONAL BIOPSY     BREAST LUMPECTOMY Right 2018   BREAST LUMPECTOMY WITH RADIOACTIVE SEED LOCALIZATION Right 11/17/2014   Procedure: RIGHT RADIOACTIVE SEED LUMPECTOMY;  Surgeon: Donnie Mesa, MD;  Location: [place] [facility];  Service: General;  Laterality: Right;   BREAST SURGERY Left    DILATION AND CURETTAGE OF UTERUS     KNEE ARTHROSCOPY Left    RE-EXCISION OF BREAST LUMPECTOMY Right 11/30/2014   Procedure: RE-EXCISION OF RIGHT BREAST LUMPECTOMY;  Surgeon: Donnie Mesa, MD;  Location: [facility];  Service: General;  Laterality: Right;    Family History  Problem Relation Age of Onset   Pancreatic cancer Mother    Heart disease Father    Colon cancer Father    Atrial fibrillation Father    Hypertension Sister    Hyperthyroidism Brother    Stroke Maternal Grandmother    Diabetes Maternal Grandfather    Stroke Paternal  Grandmother    Heart disease Paternal Grandfather     Social History   Socioeconomic History   Marital status: Married    Spouse name: Not on file   Number of children: Not on file   Years of education: Not on file   Highest education level: Not on file  Occupational History   Not on file  Tobacco Use   Smoking status: Never   Smokeless tobacco: Never  Substance and Sexual Activity   Alcohol use: No   Drug use: No   Sexual activity:  Not on file  Other Topics Concern   Not on file  Social History Narrative   Not on file   Social Determinants of Health   Financial Resource Strain: Not on file  Food Insecurity: Not on file  Transportation Needs: Not on file  Physical Activity: Not on file  Stress: Not on file  Social Connections: Not on file  Intimate Partner Violence: Not on file    Outpatient Medications Prior to Visit  Medication Sig Dispense Refill   albuterol (PROVENTIL HFA;VENTOLIN HFA) 108 (90 BASE) MCG/ACT inhaler Inhale 1-2 puffs into the lungs every 6 (six) hours as needed for wheezing or shortness of breath.      albuterol (VENTOLIN HFA) 108 (90 Base) MCG/ACT inhaler Inhale 2 puffs into the lungs every 4 hours. 18 g 11   aspirin 81 MG chewable tablet Chew 81 mg by mouth daily.     celecoxib (CELEBREX) 100 MG capsule Take 100 mg by mouth daily.      celecoxib (CELEBREX) 100 MG capsule Take 1 capsule (100 mg total) by mouth daily. 90 capsule 0   Cholecalciferol (VITAMIN D3) 125 MCG (5000 UT) CAPS Take 5,000 Units by mouth daily.     clorazepate (TRANXENE) 3.75 MG tablet TAKE ONE TABLET UP TO THREE TIMES DAILY AS NEEDED 60 tablet 0   clorazepate (TRANXENE) 3.75 MG tablet TAKE ONE TABLET UP TO THREE TIMES DAILY AS NEEDED 60 tablet 0   clorazepate (TRANXENE) 3.75 MG tablet TAKE ONE TABLET UP TO THREE TIMES DAILY AS NEEDED 60 tablet 5   cyanocobalamin (,VITAMIN B-12,) 1000 MCG/ML injection Inject 1 mL (1,000 mcg total) into the muscle every 30 days. 3 mL 3   hydrochlorothiazide (HYDRODIURIL) 25 MG tablet TAKE 1 TABLET BY MOUTH ONCE DAILY 90 tablet 3   hydrochlorothiazide (HYDRODIURIL) 25 MG tablet TAKE 1 TABLET BY MOUTH ONCE DAILY 90 tablet 3   levothyroxine (SYNTHROID) 100 MCG tablet TAKE 1 TABLET BY MOUTH ONCE DAILY 90 tablet 3   levothyroxine (SYNTHROID) 100 MCG tablet TAKE 1 TABLET BY MOUTH ONCE DAILY 90 tablet 3   pantoprazole (PROTONIX) 40 MG tablet Take 1 tablet (40 mg total) by mouth daily. 90 tablet 0    promethazine (PHENERGAN) 25 MG tablet Take 25 mg by mouth every 6 (six) hours as needed for nausea or vomiting.     Facility-Administered Medications Prior to Visit  Medication Dose Route Frequency Provider Last Rate Last Admin   triamcinolone acetonide (KENALOG-40) injection 80 mg  80 mg Intra-articular Once Rudi Bunyard, MD        Allergies:   Etodolac   Social History   Tobacco Use   Smoking status: Never   Smokeless tobacco: Never  Substance Use Topics   Alcohol use: No   Drug use: No     Review of Systems  Constitutional:  Positive for chills and malaise/fatigue. Negative for fever.  HENT:  Negative for sinus pain and sore  throat.   Respiratory:  Negative for cough and shortness of breath.   Cardiovascular:  Negative for chest pain.     Labs/Other Tests and Data Reviewed:    Recent Labs: No results found for requested labs within last 365 days.   Recent Lipid Panel No results found for: "CHOL", "TRIG", "HDL", "CHOLHDL", "LDLCALC", "LDLDIRECT"  Wt Readings from Last 3 Encounters:  11/25/20 175 lb 11.2 oz (79.7 kg)  09/03/19 175 lb 12.8 oz (79.7 kg)  08/06/19 177 lb (80.3 kg)     Objective:    Vital Signs:  There were no vitals taken for this visit.   Physical Exam   ASSESSMENT & PLAN:   1. Upper respiratory tract infection due to COVID-19 virus  Problem List Items Addressed This Visit       Respiratory   Upper respiratory tract infection due to COVID-19 virus - Primary    Your COVID test is positive. You should remain isolated and quarantine for at least 5 days from start of symptoms. You must be feeling better and be fever free without any fever reducers for at least 24 hours as well. You should wear a mask at all times when out of your home or around others for 5 days after leaving isolation.  Your household contacts should be tested as well as work contacts. If you feel worse or have increasing shortness of breath, you should be seen in person at urgent  care or the emergency room.   Rx: paxlovid Recommend rest, fluids, 3 meals per day.  Fever reducing medicines.  OTC cold and congestion medicines.        Relevant Medications   nirmatrelvir/ritonavir EUA (PAXLOVID) 20 x 150 MG & 10 x '100MG'$  TABS     Meds ordered this encounter  Medications   DISCONTD: nirmatrelvir/ritonavir EUA (PAXLOVID) 20 x 150 MG & 10 x '100MG'$  TABS    Sig: Take 3 tablets by mouth 2 (two) times daily for 5 days. (Take nirmatrelvir 150 mg two tablets twice daily for 5 days and ritonavir 100 mg one tablet twice daily for 5 days) Patient GFR is 90    Dispense:  30 tablet    Refill:  0   nirmatrelvir/ritonavir EUA (PAXLOVID) 20 x 150 MG & 10 x '100MG'$  TABS    Sig: Take 3 tablets by mouth 2 (two) times daily for 5 days. (Take nirmatrelvir 150 mg two tablets twice daily for 5 days and ritonavir 100 mg one tablet twice daily for 5 days) Patient GFR is 90    Dispense:  30 tablet    Refill:  0    COVID-19 Education: The signs and symptoms of COVID-19 were discussed with the patient and how to seek care for testing (follow up with PCP or arrange E-visit). The importance of social distancing was discussed today.  I spent 10 minutes dedicated to the care of this patient on the date of this encounter to include face-to-face time with the patient.  Follow Up:  Virtual Visit  prn  Signed,  Rochel Brome, MD  11/26/2021 7:51 PM    [place]

## 2021-11-24 ENCOUNTER — Ambulatory Visit: Payer: 59 | Admitting: Oncology

## 2021-11-26 ENCOUNTER — Encounter: Payer: Self-pay | Admitting: Family Medicine

## 2021-11-26 NOTE — Assessment & Plan Note (Signed)
Your COVID test is positive. You should remain isolated and quarantine for at least 5 days from start of symptoms. You must be feeling better and be fever free without any fever reducers for at least 24 hours as well. You should wear a mask at all times when out of your home or around others for 5 days after leaving isolation.  Your household contacts should be tested as well as work contacts. If you feel worse or have increasing shortness of breath, you should be seen in person at urgent care or the emergency room.   Rx: paxlovid Recommend rest, fluids, 3 meals per day.  Fever reducing medicines.  OTC cold and congestion medicines.

## 2021-11-26 NOTE — Patient Instructions (Signed)
Your COVID test is positive. You should remain isolated and quarantine for at least 5 days from start of symptoms. You must be feeling better and be fever free without any fever reducers for at least 24 hours as well. You should wear a mask at all times when out of your home or around others for 5 days after leaving isolation.  Your household contacts should be tested as well as work contacts. If you feel worse or have increasing shortness of breath, you should be seen in person at urgent care or the emergency room.   Rx: paxlovid Recommend rest, fluids, 3 meals per day.  Fever reducing medicines.  OTC cold and congestion medicines.

## 2021-11-28 ENCOUNTER — Ambulatory Visit
Admission: RE | Admit: 2021-11-28 | Discharge: 2021-11-28 | Disposition: A | Discharge status: Home / Self Care | Payer: 59 | Source: Ambulatory Visit | Attending: Oncology | Admitting: Oncology

## 2021-11-28 DIAGNOSIS — R92322 Mammographic fibroglandular density, left breast: Secondary | ICD-10-CM | POA: Diagnosis not present

## 2021-11-28 DIAGNOSIS — R928 Other abnormal and inconclusive findings on diagnostic imaging of breast: Secondary | ICD-10-CM

## 2021-11-28 DIAGNOSIS — N6002 Solitary cyst of left breast: Secondary | ICD-10-CM | POA: Diagnosis not present

## 2021-11-28 DIAGNOSIS — Z853 Personal history of malignant neoplasm of breast: Secondary | ICD-10-CM | POA: Diagnosis not present

## 2021-12-06 ENCOUNTER — Encounter: Payer: Self-pay | Admitting: Oncology

## 2021-12-06 ENCOUNTER — Inpatient Hospital Stay: Payer: 59 | Attending: Oncology | Admitting: Oncology

## 2021-12-06 ENCOUNTER — Other Ambulatory Visit: Payer: Self-pay | Admitting: Oncology

## 2021-12-06 VITALS — BP 131/76 | HR 101 | Temp 97.7°F | Resp 18 | Ht 63.0 in | Wt 179.1 lb

## 2021-12-06 DIAGNOSIS — D0511 Intraductal carcinoma in situ of right breast: Secondary | ICD-10-CM

## 2021-12-06 NOTE — Progress Notes (Signed)
[facility]  [address] [place],  [place]  [address] [phone]  Clinic Day:  12/06/21  Referring physician: Bess Harvest*   CHIEF COMPLAINT:  CC: History of stage 0 hormone receptor positive ductal carcinoma in situ  Current Treatment:  Surveillance   HISTORY OF PRESENT ILLNESS:  Amanda Haas is a 66 y.o. 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  Calcifications were seen on routine mammogram.  She was treated with lumpectomy in September 2016.  Pathology revealed a 0.8 cm, intermediate grade, ductal carcinoma in situ.  Estrogen and progesterone receptors were positive.  She had a positive focal lateral margin, so underwent re-excision that October.  There was residual ductal carcinoma in situ, but the final margin was clear.  She received adjuvant radiation to the right breast, completed in December 2016.  She was placed on chemoprevention with raloxifene 60 mg daily in January 2017.  She has multiple comorbidities including diabetes, hypothyroidism, hypertension, hyperlipidemia, and peptic ulcer disease.  Bilateral diagnostic mammogram in August 2019 did not reveal any evidence of malignancy.  Review of her family history reveals a paternal aunt had breast cancer at age 84, her mother had pancreatic cancer at age 44 and her father was diagnosed with colon cancer at age 42. She completed 5 years of endocrine therapy with raloxifene in January 2022.    INTERVAL HISTORY:  Amanda Haas is here for annual follow up and states that she has been well and denies complaints. Annual screening bilateral mammogram from October 4th revealed a possible mass in the left breast. She then had a diagnostic mammogram on October 10th along with an ultrasound. The revealed an oval circumscribed low density mass measuring 5-6 mm and U/S confirmed an anechoic  benign cyst.  She undergoes routine blood work with Leeroy Cha. Her  appetite is good, and she has gained 4 pounds since her last visit.  She denies fever, chills or other signs of infection.  She denies nausea, vomiting, bowel issues, or abdominal pain.  She denies sore throat, cough, dyspnea, or chest pain.  REVIEW OF SYSTEMS:  Review of Systems  Constitutional: Negative.  Negative for appetite change, chills, fatigue, fever and unexpected weight change.  HENT:  Negative.    Eyes: Negative.   Respiratory: Negative.  Negative for chest tightness, cough, hemoptysis, shortness of breath and wheezing.   Cardiovascular: Negative.  Negative for chest pain, leg swelling and palpitations.  Gastrointestinal: Negative.  Negative for abdominal distention, abdominal pain, blood in stool, constipation, diarrhea, nausea and vomiting.  Endocrine: Negative.   Genitourinary: Negative.  Negative for difficulty urinating, dysuria, frequency and hematuria.   Musculoskeletal: Negative.  Negative for arthralgias, back pain, flank pain, gait problem and myalgias.  Skin: Negative.   Neurological: Negative.  Negative for dizziness, extremity weakness, gait problem, headaches, light-headedness, numbness, seizures and speech difficulty.  Hematological: Negative.   Psychiatric/Behavioral: Negative.  Negative for depression and sleep disturbance. The patient is not nervous/anxious.      VITALS:  Blood pressure 131/76, pulse (!) 101, temperature 97.7 F (36.5 C), temperature source Oral, resp. rate 18, height '5\' 3"'$  (1.6 m), weight 179 lb 1.6 oz (81.2 kg), SpO2 96 %.  Wt Readings from Last 3 Encounters:  12/06/21 179 lb 1.6 oz (81.2 kg)  11/25/20 175 lb 11.2 oz (79.7 kg)  09/03/19 175 lb 12.8 oz (79.7 kg)  Body mass index is 31.73 kg/m.  Performance status (ECOG): 0 - Asymptomatic  PHYSICAL EXAM:  Physical Exam Constitutional:      General: She is not in acute distress.    Appearance: Normal  appearance. She is normal weight.  HENT:     Head: Normocephalic and atraumatic.  Eyes:     General: No scleral icterus.    Extraocular Movements: Extraocular movements intact.     Conjunctiva/sclera: Conjunctivae normal.     Pupils: Pupils are equal, round, and reactive to light.  Cardiovascular:     Rate and Rhythm: Normal rate and regular rhythm.     Pulses: Normal pulses.     Heart sounds: Normal heart sounds. No murmur heard.    No friction rub. No gallop.  Pulmonary:     Effort: Pulmonary effort is normal. No respiratory distress.     Breath sounds: Normal breath sounds.  Chest:     Comments: Deep scar in the inferior right breast which is well healed. Both breasts are without masses. Abdominal:     General: Bowel sounds are normal. There is no distension.     Palpations: Abdomen is soft. There is no hepatomegaly, splenomegaly or mass.     Tenderness: There is no abdominal tenderness.  Musculoskeletal:        General: Normal range of motion.     Cervical back: Normal range of motion and neck supple.     Right lower leg: Edema (trace) present.     Left lower leg: Edema (trace) present.  Lymphadenopathy:     Cervical: No cervical adenopathy.  Skin:    General: Skin is warm and dry.  Neurological:     General: No focal deficit present.     Mental Status: She is alert and oriented to person, place, and time. Mental status is at baseline.  Psychiatric:        Mood and Affect: Mood normal.        Behavior: Behavior normal.        Thought Content: Thought content normal.        Judgment: Judgment normal.     LABS:      Latest Ref Rng & Units 06/05/2019   12:28 PM  CBC  WBC 4.0 - 10.5 K/uL 5.7   Hemoglobin 12.0 - 15.0 g/dL 15.0   Hematocrit 36.0 - 46.0 % 47.4   Platelets 150 - 400 K/uL 272       Latest Ref Rng & Units 06/05/2019   12:28 PM  CMP  Glucose 70 - 99 mg/dL 100   BUN 8 - 23 mg/dL 23   Creatinine 0.44 - 1.00 mg/dL 0.65   Sodium 135 - 145 mmol/L 139    Potassium 3.5 - 5.1 mmol/L 3.6   Chloride 98 - 111 mmol/L 100   CO2 22 - 32 mmol/L 27   Calcium 8.9 - 10.3 mg/dL 9.6     STUDIES:  No results found.   HISTORY:   Allergies:  Allergies  Allergen Reactions   Nsaids     Other reaction(s): other   Etodolac Rash    Other reaction(s): Not available, Other (See Comments) NDC [id number] unknown     Current Medications: Current Outpatient Medications  Medication Sig Dispense Refill   albuterol (PROVENTIL HFA;VENTOLIN HFA) 108 (90 BASE) MCG/ACT inhaler Inhale 1-2 puffs into the lungs every 6 (six) hours as needed for wheezing or shortness of breath.      aspirin 81 MG chewable tablet Chew 81  mg by mouth daily.     celecoxib (CELEBREX) 100 MG capsule Take 1 capsule (100 mg total) by mouth daily. 90 capsule 0   Cholecalciferol (VITAMIN D3) 125 MCG (5000 UT) CAPS Take 5,000 Units by mouth daily.     clorazepate (TRANXENE) 3.75 MG tablet TAKE ONE TABLET UP TO THREE TIMES DAILY AS NEEDED 60 tablet 5   cyanocobalamin (,VITAMIN B-12,) 1000 MCG/ML injection Inject 1 mL (1,000 mcg total) into the muscle every 30 days. 3 mL 3   hydrochlorothiazide (HYDRODIURIL) 25 MG tablet TAKE 1 TABLET BY MOUTH ONCE DAILY 90 tablet 3   levothyroxine (SYNTHROID) 100 MCG tablet TAKE 1 TABLET BY MOUTH ONCE DAILY 90 tablet 3   pantoprazole (PROTONIX) 40 MG tablet Take 1 tablet (40 mg total) by mouth daily. 90 tablet 0   promethazine (PHENERGAN) 25 MG tablet Take 25 mg by mouth every 6 (six) hours as needed for nausea or vomiting.     Current Facility-Administered Medications  Medication Dose Route Frequency Provider Last Rate Last Admin   triamcinolone acetonide (KENALOG-40) injection 80 mg  80 mg Intra-articular Once CoxElnita Maxwell, MD         ASSESSMENT & PLAN:   Assessment/Plan:  Uldine Fuster is a 66 y.o. female with ductal carcinoma in situ of the breast diagnosed in August 2016.  She remains without evidence of recurrence. She completed  5 years of chemoprevention with raloxifene in January 2022. A recent screening mammogram revealed a possible mass but diagnostic study and ultrasound confirmed this to be a benign cyst.   We will plan to see her back in 1 year with bilateral mammogram for reevaluation.  The patient understands the plans discussed today and is in agreement with them.  She knows to contact our office if she develops concerns regarding her breast cancer or its treatment.   I provided 15 minutes of face-to-face time during this this encounter and > 50% was spent counseling as documented under my assessment and plan.    Derwood Kaplan, MD [facility] AT [facility] [address] [place] [address] Dept: [phone] Dept Fax: [phone]   I, Rita Ohara, am acting as scribe for Derwood Kaplan, MD  I have reviewed this report as typed by the medical scribe, and it is complete and accurate.  Derwood Kaplan, MD

## 2021-12-08 ENCOUNTER — Ambulatory Visit (INDEPENDENT_AMBULATORY_CARE_PROVIDER_SITE_OTHER): Payer: 59

## 2021-12-08 DIAGNOSIS — Z23 Encounter for immunization: Secondary | ICD-10-CM | POA: Diagnosis not present

## 2021-12-12 ENCOUNTER — Other Ambulatory Visit (HOSPITAL_COMMUNITY): Payer: Self-pay

## 2021-12-13 ENCOUNTER — Other Ambulatory Visit (HOSPITAL_COMMUNITY): Payer: Self-pay

## 2021-12-13 MED ORDER — CELECOXIB 100 MG PO CAPS
100.0000 mg | ORAL_CAPSULE | Freq: Every day | ORAL | 0 refills | Status: DC
  Filled 2021-12-13: qty 90, 90d supply, fill #0

## 2021-12-13 MED ORDER — PANTOPRAZOLE SODIUM 40 MG PO TBEC
40.0000 mg | DELAYED_RELEASE_TABLET | Freq: Every day | ORAL | 0 refills | Status: DC
  Filled 2021-12-13: qty 90, 90d supply, fill #0

## 2022-01-17 ENCOUNTER — Other Ambulatory Visit (HOSPITAL_COMMUNITY): Payer: Self-pay

## 2022-01-17 DIAGNOSIS — I10 Essential (primary) hypertension: Secondary | ICD-10-CM | POA: Diagnosis not present

## 2022-01-17 DIAGNOSIS — Z0000 Encounter for general adult medical examination without abnormal findings: Secondary | ICD-10-CM | POA: Diagnosis not present

## 2022-01-17 DIAGNOSIS — R7303 Prediabetes: Secondary | ICD-10-CM | POA: Diagnosis not present

## 2022-01-17 DIAGNOSIS — E782 Mixed hyperlipidemia: Secondary | ICD-10-CM | POA: Diagnosis not present

## 2022-01-17 DIAGNOSIS — E039 Hypothyroidism, unspecified: Secondary | ICD-10-CM | POA: Diagnosis not present

## 2022-01-17 DIAGNOSIS — K219 Gastro-esophageal reflux disease without esophagitis: Secondary | ICD-10-CM | POA: Diagnosis not present

## 2022-01-17 DIAGNOSIS — R5383 Other fatigue: Secondary | ICD-10-CM | POA: Diagnosis not present

## 2022-01-17 DIAGNOSIS — J4520 Mild intermittent asthma, uncomplicated: Secondary | ICD-10-CM | POA: Diagnosis not present

## 2022-01-17 DIAGNOSIS — M25561 Pain in right knee: Secondary | ICD-10-CM | POA: Diagnosis not present

## 2022-01-17 DIAGNOSIS — R5381 Other malaise: Secondary | ICD-10-CM | POA: Diagnosis not present

## 2022-01-17 DIAGNOSIS — Z789 Other specified health status: Secondary | ICD-10-CM | POA: Diagnosis not present

## 2022-01-17 MED ORDER — PROMETHAZINE HCL 25 MG PO TABS
25.0000 mg | ORAL_TABLET | Freq: Four times a day (QID) | ORAL | 2 refills | Status: AC | PRN
  Filled 2022-01-17: qty 90, 23d supply, fill #0

## 2022-01-17 MED ORDER — HYDROCHLOROTHIAZIDE 25 MG PO TABS
25.0000 mg | ORAL_TABLET | Freq: Every day | ORAL | 3 refills | Status: DC
  Filled 2022-02-05: qty 90, 90d supply, fill #0
  Filled 2022-05-14 – 2022-05-22 (×2): qty 90, 90d supply, fill #1
  Filled 2022-08-31 – 2022-09-05 (×4): qty 90, 90d supply, fill #2
  Filled 2022-12-15: qty 90, 90d supply, fill #3

## 2022-01-17 MED ORDER — PANTOPRAZOLE SODIUM 40 MG PO TBEC
40.0000 mg | DELAYED_RELEASE_TABLET | Freq: Every day | ORAL | 3 refills | Status: AC
  Filled 2022-03-17: qty 90, 90d supply, fill #0
  Filled 2022-06-13: qty 90, 90d supply, fill #1
  Filled 2022-09-23: qty 90, 90d supply, fill #2
  Filled 2022-12-15: qty 90, 90d supply, fill #3

## 2022-01-17 MED ORDER — LEVOTHYROXINE SODIUM 100 MCG PO TABS
100.0000 ug | ORAL_TABLET | Freq: Every day | ORAL | 3 refills | Status: AC
  Filled 2022-02-05: qty 90, 90d supply, fill #0
  Filled 2022-05-14 – 2022-05-22 (×2): qty 90, 90d supply, fill #1
  Filled 2022-08-31 – 2022-09-05 (×4): qty 90, 90d supply, fill #2
  Filled 2022-12-15: qty 90, 90d supply, fill #3

## 2022-01-17 MED ORDER — CELECOXIB 100 MG PO CAPS
100.0000 mg | ORAL_CAPSULE | Freq: Every day | ORAL | 3 refills | Status: AC
  Filled 2022-01-17 – 2022-03-17 (×2): qty 90, 90d supply, fill #0
  Filled 2022-06-13: qty 90, 90d supply, fill #1
  Filled 2022-09-23: qty 90, 90d supply, fill #2
  Filled 2022-12-15: qty 90, 90d supply, fill #3

## 2022-01-18 ENCOUNTER — Ambulatory Visit: Payer: 59 | Admitting: Family Medicine

## 2022-01-22 ENCOUNTER — Ambulatory Visit (INDEPENDENT_AMBULATORY_CARE_PROVIDER_SITE_OTHER): Payer: 59 | Admitting: Family Medicine

## 2022-01-22 VITALS — BP 130/80 | HR 92 | Temp 97.2°F | Resp 16 | Ht 63.0 in | Wt 175.0 lb

## 2022-01-22 DIAGNOSIS — G8929 Other chronic pain: Secondary | ICD-10-CM

## 2022-01-22 DIAGNOSIS — M25561 Pain in right knee: Secondary | ICD-10-CM | POA: Diagnosis not present

## 2022-01-22 NOTE — Progress Notes (Unsigned)
Acute Office Visit  Subjective:    Patient ID: Amanda Haas, female    DOB: January 14, 1956, 66 y.o.   MRN: [id number]  Chief Complaint  Patient presents with  . Knee Injection    HPI: Patient is in today for right knee pain and request knee injection. She had arthroscopic surgery of her right knee in the fall 2020. Reports her knee swells and is painful. Limps on a regularly basis.   Past Medical History:  Diagnosis Date  . Arthritis    knees  . Breast cancer ([place]) 2016   Right Breast Cancer  . Encounter for long-term (current) use of high-risk medication 10/19/2015  . Essential hypertension 10/19/2015  . Gastroesophageal reflux disease 10/19/2015  . GERD (gastroesophageal reflux disease)   . History of breast cancer 10/19/2015   Formatting of this note might be different from the original. 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 Follows with Dr. Hinton Rao, diagnosed in 2016 started her tamoxifen and 2017 for a total of 5 years  . Hypertension   . Hypothyroidism   . Hypothyroidism (acquired) 10/19/2015  . Malaise and fatigue 06/18/2018  . Meniere's disease 10/19/2015  . Mild intermittent asthma 10/19/2015  . Pain in right knee 09/18/2018  . Personal history of radiation therapy 2016   Right Breast Cancer  . PONV (postoperative nausea and vomiting)   . Seasonal allergies   . Vitamin B12 deficiency 12/17/2018  . Vitamin D deficiency 10/19/2015    Past Surgical History:  Procedure Laterality Date  . APPENDECTOMY    . BREAST BIOPSY    . BREAST EXCISIONAL BIOPSY    . BREAST LUMPECTOMY Right 11/30/2014   revision-br ca  . BREAST LUMPECTOMY WITH RADIOACTIVE SEED LOCALIZATION Right 11/17/2014   Procedure: RIGHT RADIOACTIVE SEED LUMPECTOMY;  Surgeon: Donnie Mesa, MD;  Location: [place];  Service: General;  Laterality: Right;  . BREAST SURGERY  Left   . DILATION AND CURETTAGE OF UTERUS    . KNEE ARTHROSCOPY Left   . RE-EXCISION OF BREAST LUMPECTOMY Right 11/30/2014   Procedure: RE-EXCISION OF RIGHT BREAST LUMPECTOMY;  Surgeon: Donnie Mesa, MD;  Location: [place];  Service: General;  Laterality: Right;    Family History  Problem Relation Age of Onset  . Pancreatic cancer Mother   . Heart disease Father   . Colon cancer Father   . Atrial fibrillation Father   . Hypertension Sister   . Breast cancer Paternal Aunt 35  . Breast cancer Paternal Aunt 84  . Stroke Maternal Grandmother   . Diabetes Maternal Grandfather   . Stroke Paternal Grandmother   . Heart disease Paternal Grandfather   . Hyperthyroidism Brother     Social History   Socioeconomic History  . Marital status: Married    Spouse name: Not on file  . Number of children: Not on file  . Years of education: Not on file  . Highest education level: Not on file  Occupational History  . Not on file  Tobacco Use  . Smoking status: Never  . Smokeless tobacco: Never  Substance and Sexual Activity  . Alcohol use: No  . Drug use: No  . Sexual activity: Not on file  Other Topics Concern  . Not on file  Social History Narrative  . Not on file   Social Determinants of Health   Financial Resource Strain: Not on  file  Food Insecurity: Not on file  Transportation Needs: Not on file  Physical Activity: Not on file  Stress: Not on file  Social Connections: Not on file  Intimate Partner Violence: Not on file    Outpatient Medications Prior to Visit  Medication Sig Dispense Refill  . albuterol (PROVENTIL HFA;VENTOLIN HFA) 108 (90 BASE) MCG/ACT inhaler Inhale 1-2 puffs into the lungs every 6 (six) hours as needed for wheezing or shortness of breath.     Marland Kitchen aspirin 81 MG chewable tablet Chew 81 mg by mouth daily.    . celecoxib (CELEBREX) 100 MG capsule Take 1 capsule (100 mg total) by mouth daily. 90 capsule 3  . Cholecalciferol (VITAMIN D3)  125 MCG (5000 UT) CAPS Take 5,000 Units by mouth daily.    . clorazepate (TRANXENE) 3.75 MG tablet TAKE ONE TABLET UP TO THREE TIMES DAILY AS NEEDED 60 tablet 5  . cyanocobalamin (,VITAMIN B-12,) 1000 MCG/ML injection Inject 1 mL (1,000 mcg total) into the muscle every 30 days. 3 mL 3  . hydrochlorothiazide (HYDRODIURIL) 25 MG tablet Take 1 tablet (25 mg total) by mouth daily. 90 tablet 3  . levothyroxine (SYNTHROID) 100 MCG tablet Take 1 tablet (100 mcg total) by mouth daily. 90 tablet 3  . pantoprazole (PROTONIX) 40 MG tablet Take 1 tablet (40 mg total) by mouth daily. 90 tablet 3  . promethazine (PHENERGAN) 25 MG tablet Take 25 mg by mouth every 6 (six) hours as needed for nausea or vomiting.    . promethazine (PHENERGAN) 25 MG tablet Take 1 tablet (25 mg total) by mouth every 6 (six) hours as needed for nausea. 90 tablet 2   Facility-Administered Medications Prior to Visit  Medication Dose Route Frequency Provider Last Rate Last Admin  . triamcinolone acetonide (KENALOG-40) injection 80 mg  80 mg Intra-articular Once Rochel Brome, MD        Allergies  Allergen Reactions  . Nsaids     Other reaction(s): other  . Etodolac Rash    Other reaction(s): Not available, Other (See Comments) [place] [id number] unknown     Review of Systems  Constitutional:  Negative for chills, fatigue and fever.  HENT:  Positive for ear pain.   Respiratory:  Negative for cough and shortness of breath.   Musculoskeletal:  Positive for arthralgias (Right knee pain). Negative for back pain and myalgias.       Objective:    Physical Exam  BP 130/80   Pulse 92   Temp (!) 97.2 F (36.2 C)   Resp 16   Ht '5\' 3"'$  (1.6 m)   Wt 175 lb (79.4 kg)   BMI 31.00 kg/m  Wt Readings from Last 3 Encounters:  01/22/22 175 lb (79.4 kg)  12/06/21 179 lb 1.6 oz (81.2 kg)  11/25/20 175 lb 11.2 oz (79.7 kg)    Health Maintenance Due  Topic Date Due  . HEMOGLOBIN A1C  Never done  . FOOT EXAM  Never done  .  HIV Screening  Never done  . Diabetic kidney evaluation - Urine ACR  Never done  . Hepatitis C Screening  Never done  . Zoster Vaccines- Shingrix (1 of 2) Never done  . PAP SMEAR-Modifier  Never done  . COLONOSCOPY (Pts 45-85yr Insurance coverage will need to be confirmed)  Never done  . COVID-19 Vaccine (3 - Mixed Product risk series) 04/21/2019  . Diabetic kidney evaluation - GFR measurement  06/04/2020  . OPHTHALMOLOGY EXAM  12/13/2020  . Pneumonia Vaccine 65+  Years old (2 - PPSV23 or PCV20) 09/28/2021  . DEXA SCAN  Never done    There are no preventive care reminders to display for this patient.   No results found for: "TSH" Lab Results  Component Value Date   WBC 5.7 06/05/2019   HGB 15.0 06/05/2019   HCT 47.4 (H) 06/05/2019   MCV 87.6 06/05/2019   PLT 272 06/05/2019   Lab Results  Component Value Date   NA 139 06/05/2019   K 3.6 06/05/2019   CO2 27 06/05/2019   GLUCOSE 100 (H) 06/05/2019   BUN 23 06/05/2019   CREATININE 0.65 06/05/2019   CALCIUM 9.6 06/05/2019   ANIONGAP 12 06/05/2019   No results found for: "CHOL" No results found for: "HDL" No results found for: "LDLCALC" No results found for: "TRIG" No results found for: "CHOLHDL" No results found for: "HGBA1C"     Assessment & Plan:   Problem List Items Addressed This Visit   None  No orders of the defined types were placed in this encounter.   No orders of the defined types were placed in this encounter.    Follow-up: No follow-ups on file.  An After Visit Summary was printed and given to the patient.  Rochel Brome, MD [facility] [phone]

## 2022-01-22 NOTE — Assessment & Plan Note (Signed)
After consent was obtained, using sterile technique the right knee was prepped and plain Lidocaine 1% was used as local anesthetic. Steroid 80 mg and 5 ml plain Lidocaine was then injected and the needle withdrawn. The procedure was well tolerated. The patient is asked to continue to rest the joint for a few more days before resuming regular activities.  It may be more painful for the first 1-2 days. Watch for fever, or increased swelling or persistent pain in the joint. Call or return to clinic prn if such symptoms occur or there is failure to improve as anticipated.

## 2022-01-23 DIAGNOSIS — G43109 Migraine with aura, not intractable, without status migrainosus: Secondary | ICD-10-CM | POA: Diagnosis not present

## 2022-01-23 DIAGNOSIS — H25813 Combined forms of age-related cataract, bilateral: Secondary | ICD-10-CM | POA: Diagnosis not present

## 2022-01-23 DIAGNOSIS — E119 Type 2 diabetes mellitus without complications: Secondary | ICD-10-CM | POA: Diagnosis not present

## 2022-01-25 ENCOUNTER — Encounter: Payer: Self-pay | Admitting: Family Medicine

## 2022-01-25 MED ORDER — TRIAMCINOLONE ACETONIDE 40 MG/ML IJ SUSP: 80.0000 mg | Freq: Once | INTRAMUSCULAR | Status: DC

## 2022-02-05 ENCOUNTER — Other Ambulatory Visit (HOSPITAL_COMMUNITY): Payer: Self-pay

## 2022-02-05 ENCOUNTER — Other Ambulatory Visit: Payer: Self-pay

## 2022-02-23 ENCOUNTER — Encounter: Payer: Self-pay | Admitting: Family Medicine

## 2022-03-02 DIAGNOSIS — H25812 Combined forms of age-related cataract, left eye: Secondary | ICD-10-CM | POA: Diagnosis not present

## 2022-03-02 DIAGNOSIS — H25811 Combined forms of age-related cataract, right eye: Secondary | ICD-10-CM | POA: Diagnosis not present

## 2022-03-17 ENCOUNTER — Other Ambulatory Visit (HOSPITAL_COMMUNITY): Payer: Self-pay

## 2022-03-19 ENCOUNTER — Other Ambulatory Visit (HOSPITAL_COMMUNITY): Payer: Self-pay

## 2022-03-21 DIAGNOSIS — H269 Unspecified cataract: Secondary | ICD-10-CM | POA: Diagnosis not present

## 2022-03-21 DIAGNOSIS — H25812 Combined forms of age-related cataract, left eye: Secondary | ICD-10-CM | POA: Diagnosis not present

## 2022-04-11 DIAGNOSIS — H25811 Combined forms of age-related cataract, right eye: Secondary | ICD-10-CM | POA: Diagnosis not present

## 2022-04-11 DIAGNOSIS — H269 Unspecified cataract: Secondary | ICD-10-CM | POA: Diagnosis not present

## 2022-04-16 ENCOUNTER — Encounter: Payer: Self-pay | Admitting: Family Medicine

## 2022-04-16 ENCOUNTER — Ambulatory Visit: Payer: Commercial Managed Care - PPO | Admitting: Family Medicine

## 2022-04-16 VITALS — BP 134/86 | Temp 97.0°F | Resp 16 | Ht 63.0 in | Wt 175.0 lb

## 2022-04-16 DIAGNOSIS — N3001 Acute cystitis with hematuria: Secondary | ICD-10-CM

## 2022-04-16 LAB — POCT URINALYSIS DIP (CLINITEK)
Bilirubin, UA: NEGATIVE
Glucose, UA: NEGATIVE mg/dL
Ketones, POC UA: NEGATIVE mg/dL
Nitrite, UA: NEGATIVE
POC PROTEIN,UA: NEGATIVE
Spec Grav, UA: 1.02 (ref 1.010–1.025)
Urobilinogen, UA: 0.2 E.U./dL
pH, UA: 5.5 (ref 5.0–8.0)

## 2022-04-16 MED ORDER — NITROFURANTOIN MONOHYD MACRO 100 MG PO CAPS: 100.0000 mg | ORAL_CAPSULE | Freq: Two times a day (BID) | ORAL | 0 refills | Status: DC

## 2022-04-16 NOTE — Assessment & Plan Note (Signed)
Started on Macrobid 100 mg BID x 7 days.

## 2022-04-16 NOTE — Progress Notes (Signed)
Acute Office Visit  Subjective:    Patient ID: Amanda Haas, female    DOB: June 11, 1955, 67 y.o.   MRN: [id number]  Chief Complaint  Patient presents with   Urinary Tract Infection    HPI: Patient is in today for Urinary symptoms  She reports new onset dysuria and back pain . The current episode started a few days ago and is staying constant. Patient states symptoms are moderate in intensity, occurring intermittently. She  has not been recently treated for similar symptoms.       Past Medical History:  Diagnosis Date   Arthritis    knees   Breast cancer ([place]) 2016   Right Breast Cancer   Encounter for long-term (current) use of high-risk medication 10/19/2015   Essential hypertension 10/19/2015   Gastroesophageal reflux disease 10/19/2015   GERD (gastroesophageal reflux disease)    History of breast cancer 10/19/2015   Formatting of this note might be different from the original. 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 Follows with Dr. Hinton Rao, diagnosed in 2016 started her tamoxifen and 2017 for a total of 5 years   Hypertension    Hypothyroidism    Hypothyroidism (acquired) 10/19/2015   Malaise and fatigue 06/18/2018   Meniere's disease 10/19/2015   Mild intermittent asthma 10/19/2015   Pain in right knee 09/18/2018   Personal history of radiation therapy 2016   Right Breast Cancer   PONV (postoperative nausea and vomiting)    Seasonal allergies    Vitamin B12 deficiency 12/17/2018   Vitamin D deficiency 10/19/2015    Past Surgical History:  Procedure Laterality Date   APPENDECTOMY     BREAST BIOPSY     BREAST EXCISIONAL BIOPSY     BREAST LUMPECTOMY Right 11/30/2014   revision-br ca   BREAST LUMPECTOMY WITH RADIOACTIVE SEED LOCALIZATION Right 11/17/2014   Procedure: RIGHT RADIOACTIVE SEED LUMPECTOMY;  Surgeon: Donnie Mesa, MD;  Location:  [place];  Service: General;  Laterality: Right;   BREAST SURGERY Left    DILATION AND CURETTAGE OF UTERUS     KNEE ARTHROSCOPY Left    RE-EXCISION OF BREAST LUMPECTOMY Right 11/30/2014   Procedure: RE-EXCISION OF RIGHT BREAST LUMPECTOMY;  Surgeon: Donnie Mesa, MD;  Location: [place];  Service: General;  Laterality: Right;    Family History  Problem Relation Age of Onset   Pancreatic cancer Mother    Heart disease Father    Colon cancer Father    Atrial fibrillation Father    Hypertension Sister    Breast cancer Paternal Aunt 29   Breast cancer Paternal Aunt 68   Stroke Maternal Grandmother    Diabetes Maternal Grandfather    Stroke Paternal Grandmother    Heart disease Paternal Grandfather    Hyperthyroidism Brother     Social History   Socioeconomic History   Marital status: Married    Spouse name: Not on file   Number of children: Not on file   Years of education: Not on file   Highest education level: Not on file  Occupational History   Not on file  Tobacco Use   Smoking status: Never   Smokeless tobacco: Never  Substance and Sexual Activity   Alcohol use: No   Drug use: No   Sexual activity: Not on file  Other Topics Concern   Not on file  Social History Narrative  Not on file   Social Determinants of Health   Financial Resource Strain: Low Risk  (04/16/2022)   Overall Financial Resource Strain (CARDIA)    Difficulty of Paying Living Expenses: Not hard at all  Food Insecurity: No Food Insecurity (04/16/2022)   Hunger Vital Sign    Worried About Running Out of Food in the Last Year: Never true    Ran Out of Food in the Last Year: Never true  Transportation Needs: No Transportation Needs (04/16/2022)   PRAPARE - Hydrologist (Medical): No    Lack of Transportation (Non-Medical): No  Physical Activity: Inactive (04/16/2022)   Exercise Vital Sign    Days of Exercise per Week: 0 days    Minutes  of Exercise per Session: 0 min  Stress: No Stress Concern Present (04/16/2022)   [place]    Feeling of Stress : Not at all  Social Connections: Moderately Integrated (04/16/2022)   Social Connection and Isolation Panel [NHANES]    Frequency of Communication with Friends and Family: More than three times a week    Frequency of Social Gatherings with Friends and Family: More than three times a week    Attends Religious Services: More than 4 times per year    Active Member of [employer] or Organizations: No    Attends Archivist Meetings: Never    Marital Status: Married  Human resources officer Violence: Not At Risk (04/16/2022)   Humiliation, Afraid, Rape, and Kick questionnaire    Fear of Current or Ex-Partner: No    Emotionally Abused: No    Physically Abused: No    Sexually Abused: No    Outpatient Medications Prior to Visit  Medication Sig Dispense Refill   albuterol (PROVENTIL HFA;VENTOLIN HFA) 108 (90 BASE) MCG/ACT inhaler Inhale 1-2 puffs into the lungs every 6 (six) hours as needed for wheezing or shortness of breath.      aspirin 81 MG chewable tablet Chew 81 mg by mouth daily.     celecoxib (CELEBREX) 100 MG capsule Take 1 capsule (100 mg total) by mouth daily. 90 capsule 3   Cholecalciferol (VITAMIN D3) 125 MCG (5000 UT) CAPS Take 5,000 Units by mouth daily.     clorazepate (TRANXENE) 3.75 MG tablet TAKE ONE TABLET UP TO THREE TIMES DAILY AS NEEDED 60 tablet 5   cyanocobalamin (VITAMIN B12) 1000 MCG/ML injection Inject 1 mL (1,000 mcg total) into the muscle every 30 days. 3 mL 3   hydrochlorothiazide (HYDRODIURIL) 25 MG tablet Take 1 tablet (25 mg total) by mouth daily. 90 tablet 3   levothyroxine (SYNTHROID) 100 MCG tablet Take 1 tablet (100 mcg total) by mouth daily. 90 tablet 3   pantoprazole (PROTONIX) 40 MG tablet Take 1 tablet (40 mg total) by mouth daily. 90 tablet 3   promethazine (PHENERGAN) 25 MG tablet  Take 25 mg by mouth every 6 (six) hours as needed for nausea or vomiting.     promethazine (PHENERGAN) 25 MG tablet Take 1 tablet (25 mg total) by mouth every 6 (six) hours as needed for nausea. 90 tablet 2   Facility-Administered Medications Prior to Visit  Medication Dose Route Frequency Provider Last Rate Last Admin   triamcinolone acetonide (KENALOG-40) injection 80 mg  80 mg Intra-articular Once Jayveion Stalling, Elnita Maxwell, MD        Allergies  Allergen Reactions   Nsaids     Other reaction(s): other   Etodolac Rash  Other reaction(s): Not available, Other (See Comments) [place] [id number] unknown     Review of Systems  Constitutional:  Negative for chills and fever.  Respiratory:  Negative for shortness of breath.   Cardiovascular:  Negative for chest pain.  Gastrointestinal:  Negative for abdominal pain and nausea.  Genitourinary:  Positive for dysuria. Negative for flank pain, frequency and urgency.  Musculoskeletal:  Positive for back pain.       Objective:        04/16/2022    4:20 PM 01/22/2022    1:36 PM 12/06/2021    1:47 PM  Vitals with BMI  Height '5\' 3"'$  '5\' 3"'$  '5\' 3"'$   Weight 175 lbs 175 lbs 179 lbs 2 oz  BMI 31.01 [id number] [id number]  Systolic [id number] [id number]  Diastolic 86 80 76  Pulse  92 101    No data found.   Physical Exam Vitals reviewed.  Constitutional:      Appearance: Normal appearance. She is normal weight.  Neck:     Vascular: No carotid bruit.  Cardiovascular:     Rate and Rhythm: Normal rate and regular rhythm.     Heart sounds: Normal heart sounds.  Pulmonary:     Effort: Pulmonary effort is normal. No respiratory distress.     Breath sounds: Normal breath sounds.  Abdominal:     General: Abdomen is flat. Bowel sounds are normal.     Palpations: Abdomen is soft.     Tenderness: There is no abdominal tenderness. There is no right CVA tenderness or left CVA tenderness.  Neurological:     Mental Status: She is alert and oriented to person, place, and  time.  Psychiatric:        Mood and Affect: Mood normal.        Behavior: Behavior normal.     Health Maintenance Due  Topic Date Due   HEMOGLOBIN A1C  Never done   FOOT EXAM  Never done   HIV Screening  Never done   Diabetic kidney evaluation - Urine ACR  Never done   Hepatitis C Screening  Never done   Zoster Vaccines- Shingrix (1 of 2) Never done   PAP SMEAR-Modifier  Never done   COLONOSCOPY (Pts 45-76yr Insurance coverage will need to be confirmed)  Never done   COVID-19 Vaccine (3 - Mixed Product risk series) 04/21/2019   Diabetic kidney evaluation - eGFR measurement  06/04/2020   OPHTHALMOLOGY EXAM  12/13/2020   Pneumonia Vaccine 66 Years old (2 of 2 - PPSV23 or PCV20) 09/28/2021   DEXA SCAN  Never done    There are no preventive care reminders to display for this patient.   No results found for: "TSH" Lab Results  Component Value Date   WBC 5.7 06/05/2019   HGB 15.0 06/05/2019   HCT 47.4 (H) 06/05/2019   MCV 87.6 06/05/2019   PLT 272 06/05/2019   Lab Results  Component Value Date   NA 139 06/05/2019   K 3.6 06/05/2019   CO2 27 06/05/2019   GLUCOSE 100 (H) 06/05/2019   BUN 23 06/05/2019   CREATININE 0.65 06/05/2019   CALCIUM 9.6 06/05/2019   ANIONGAP 12 06/05/2019   No results found for: "CHOL" No results found for: "HDL" No results found for: "LDLCALC" No results found for: "TRIG" No results found for: "CHOLHDL" No results found for: "HGBA1C"     Assessment & Plan:  Acute cystitis with hematuria Started on Macrobid 100 mg BID x 7 days.  Meds ordered this encounter  Medications   nitrofurantoin, macrocrystal-monohydrate, (MACROBID) 100 MG capsule    Sig: Take 1 capsule (100 mg total) by mouth 2 (two) times daily.    Dispense:  17 capsule    Refill:  0    Orders Placed This Encounter  Procedures   Urine Culture   POCT URINALYSIS DIP (CLINITEK)     Follow-up: Return if symptoms worsen or fail to improve.  An After Visit Summary  was printed and given to the patient.  Rochel Brome, MD [facility] [phone]

## 2022-04-17 DIAGNOSIS — N3001 Acute cystitis with hematuria: Secondary | ICD-10-CM | POA: Diagnosis not present

## 2022-04-19 ENCOUNTER — Other Ambulatory Visit: Payer: Self-pay | Admitting: Family Medicine

## 2022-04-19 LAB — URINE CULTURE

## 2022-04-19 MED ORDER — AMOXICILLIN 500 MG PO CAPS: 500.0000 mg | ORAL_CAPSULE | Freq: Two times a day (BID) | ORAL | 0 refills | Status: AC

## 2022-05-07 ENCOUNTER — Encounter: Payer: Self-pay | Admitting: Family Medicine

## 2022-05-07 ENCOUNTER — Other Ambulatory Visit (HOSPITAL_COMMUNITY): Payer: Self-pay

## 2022-05-07 ENCOUNTER — Ambulatory Visit: Payer: Commercial Managed Care - PPO | Admitting: Family Medicine

## 2022-05-07 VITALS — BP 122/78 | HR 96 | Temp 96.0°F | Resp 16 | Ht 63.0 in | Wt 175.0 lb

## 2022-05-07 DIAGNOSIS — N3001 Acute cystitis with hematuria: Secondary | ICD-10-CM | POA: Diagnosis not present

## 2022-05-07 LAB — POCT URINALYSIS DIP (CLINITEK)
Bilirubin, UA: NEGATIVE
Glucose, UA: 100 mg/dL — AB
Ketones, POC UA: NEGATIVE mg/dL
Nitrite, UA: NEGATIVE
POC PROTEIN,UA: NEGATIVE
Spec Grav, UA: 1.02 (ref 1.010–1.025)
Urobilinogen, UA: NEGATIVE E.U./dL — AB
pH, UA: 6 (ref 5.0–8.0)

## 2022-05-07 MED ORDER — AMOXICILLIN-POT CLAVULANATE 500-125 MG PO TABS: 1.0000 | ORAL_TABLET | Freq: Three times a day (TID) | ORAL | 0 refills | Status: DC

## 2022-05-07 MED ORDER — AMOXICILLIN-POT CLAVULANATE 500-125 MG PO TABS
1.0000 | ORAL_TABLET | Freq: Three times a day (TID) | ORAL | 0 refills | Status: DC
  Filled 2022-05-07: qty 21, 7d supply, fill #0

## 2022-05-07 MED ORDER — CEFTRIAXONE SODIUM 1 G IJ SOLR
1.0000 g | Freq: Once | INTRAMUSCULAR | Status: AC
  Administered 2022-05-07: 1 g via INTRAMUSCULAR

## 2022-05-07 NOTE — Progress Notes (Unsigned)
Acute Office Visit  Subjective:    Patient ID: Amanda Haas, female    DOB: [birth year], 67 y.o.   MRN: [id number]  Chief Complaint  Patient presents with   right flank pain    HPI: Patient is in today for right flank pain x several days. She was treated for a uti a couple of weeks ago. She completed amoxicillin and restarted remaining macrobid over the weekend when became symptomatic. Had dysuria and some lower abdominal pressure. No fevers. No nausea or vomiting.   Past Medical History:  Diagnosis Date   Arthritis    knees   Breast cancer ([place]) 2016   Right Breast Cancer   Encounter for long-term (current) use of high-risk medication 10/19/2015   Essential hypertension 10/19/2015   Gastroesophageal reflux disease 10/19/2015   GERD (gastroesophageal reflux disease)    History of breast cancer 10/19/2015   Formatting of this note might be different from the original. 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 Follows with Dr. Hinton Rao, diagnosed in 2016 started her tamoxifen and 2017 for a total of 5 years   Hypertension    Hypothyroidism    Hypothyroidism (acquired) 10/19/2015   Malaise and fatigue 06/18/2018   Meniere's disease 10/19/2015   Mild intermittent asthma 10/19/2015   Pain in right knee 09/18/2018   Personal history of radiation therapy 2016   Right Breast Cancer   PONV (postoperative nausea and vomiting)    Seasonal allergies    Vitamin B12 deficiency 12/17/2018   Vitamin D deficiency 10/19/2015    Past Surgical History:  Procedure Laterality Date   APPENDECTOMY     BREAST BIOPSY     BREAST EXCISIONAL BIOPSY     BREAST LUMPECTOMY Right 11/30/2014   revision-br ca   BREAST LUMPECTOMY WITH RADIOACTIVE SEED LOCALIZATION Right 11/17/2014   Procedure: RIGHT RADIOACTIVE SEED LUMPECTOMY;  Surgeon: Donnie Mesa, MD;  Location: [facility];  Service: General;  Laterality: Right;   BREAST SURGERY Left    DILATION AND CURETTAGE OF UTERUS     KNEE ARTHROSCOPY Left    RE-EXCISION OF BREAST LUMPECTOMY Right 11/30/2014   Procedure: RE-EXCISION OF RIGHT BREAST LUMPECTOMY;  Surgeon: Donnie Mesa, MD;  Location: [facility];  Service: General;  Laterality: Right;    Family History  Problem Relation Age of Onset   Pancreatic cancer Mother    Heart disease Father    Colon cancer Father    Atrial fibrillation Father    Hypertension Sister    Breast cancer Paternal Aunt 1   Breast cancer Paternal Aunt 68   Stroke Maternal Grandmother    Diabetes Maternal Grandfather    Stroke Paternal Grandmother    Heart disease Paternal Grandfather    Hyperthyroidism Brother     Social History   Socioeconomic History   Marital status: Married    Spouse name: Not on file   Number of children: Not on file   Years of education: Not on file   Highest education level: Not on file  Occupational History   Not on file  Tobacco Use   Smoking status: Never   Smokeless tobacco: Never  Substance and Sexual Activity   Alcohol use: No   Drug use: No   Sexual activity: Not on file  Other Topics Concern   Not on file  Social History Narrative   Not on file  Social Determinants of Health   Financial Resource Strain: Low Risk  (04/16/2022)   Overall Financial Resource Strain (CARDIA)    Difficulty of Paying Living Expenses: Not hard at all  Food Insecurity: No Food Insecurity (04/16/2022)   Hunger Vital Sign    Worried About Running Out of Food in the Last Year: Never true    Ran Out of Food in the Last Year: Never true  Transportation Needs: No Transportation Needs (04/16/2022)   PRAPARE - Hydrologist (Medical): No    Lack of Transportation (Non-Medical): No  Physical Activity: Inactive (04/16/2022)   Exercise Vital Sign    Days of Exercise per Week: 0 days    Minutes of Exercise per  Session: 0 min  Stress: No Stress Concern Present (04/16/2022)   [place]    Feeling of Stress : Not at all  Social Connections: Moderately Integrated (04/16/2022)   Social Connection and Isolation Panel [NHANES]    Frequency of Communication with Friends and Family: More than three times a week    Frequency of Social Gatherings with Friends and Family: More than three times a week    Attends Religious Services: More than 4 times per year    Active Member of [employer] or Organizations: No    Attends Archivist Meetings: Never    Marital Status: Married  Human resources officer Violence: Not At Risk (04/16/2022)   Humiliation, Afraid, Rape, and Kick questionnaire    Fear of Current or Ex-Partner: No    Emotionally Abused: No    Physically Abused: No    Sexually Abused: No    Outpatient Medications Prior to Visit  Medication Sig Dispense Refill   albuterol (PROVENTIL HFA;VENTOLIN HFA) 108 (90 BASE) MCG/ACT inhaler Inhale 1-2 puffs into the lungs every 6 (six) hours as needed for wheezing or shortness of breath.      aspirin 81 MG chewable tablet Chew 81 mg by mouth daily.     celecoxib (CELEBREX) 100 MG capsule Take 1 capsule (100 mg total) by mouth daily. 90 capsule 3   Cholecalciferol (VITAMIN D3) 125 MCG (5000 UT) CAPS Take 5,000 Units by mouth daily.     clorazepate (TRANXENE) 3.75 MG tablet TAKE ONE TABLET UP TO THREE TIMES DAILY AS NEEDED 60 tablet 5   cyanocobalamin (VITAMIN B12) 1000 MCG/ML injection Inject 1 mL (1,000 mcg total) into the muscle every 30 days. 3 mL 3   hydrochlorothiazide (HYDRODIURIL) 25 MG tablet Take 1 tablet (25 mg total) by mouth daily. 90 tablet 3   levothyroxine (SYNTHROID) 100 MCG tablet Take 1 tablet (100 mcg total) by mouth daily. 90 tablet 3   nitrofurantoin, macrocrystal-monohydrate, (MACROBID) 100 MG capsule Take 1 capsule (100 mg total) by mouth 2 (two) times daily. 17 capsule 0    pantoprazole (PROTONIX) 40 MG tablet Take 1 tablet (40 mg total) by mouth daily. 90 tablet 3   promethazine (PHENERGAN) 25 MG tablet Take 25 mg by mouth every 6 (six) hours as needed for nausea or vomiting.     promethazine (PHENERGAN) 25 MG tablet Take 1 tablet (25 mg total) by mouth every 6 (six) hours as needed for nausea. 90 tablet 2   Facility-Administered Medications Prior to Visit  Medication Dose Route Frequency Provider Last Rate Last Admin   triamcinolone acetonide (KENALOG-40) injection 80 mg  80 mg Intra-articular Once Rochel Brome, MD        Allergies  Allergen  Reactions   Nsaids     Other reaction(s): other   Etodolac Rash    Other reaction(s): Not available, Other (See Comments) [place] [id number] unknown     Review of Systems  Constitutional:  Negative for chills and fever.  Gastrointestinal:  Positive for abdominal pain. Negative for nausea and vomiting.       Objective:        05/07/2022    9:32 AM 04/16/2022    4:20 PM 01/22/2022    1:36 PM  Vitals with BMI  Height 5\' 3"  5\' 3"  5\' 3"   Weight 175 lbs 175 lbs 175 lbs  BMI 31.01 [id number]  Systolic [id number]  Diastolic 78 86 80  Pulse 96  92    No data found.   Physical Exam Vitals reviewed.  Constitutional:      Appearance: Normal appearance. She is normal weight.  Neck:     Vascular: No carotid bruit.  Cardiovascular:     Rate and Rhythm: Normal rate and regular rhythm.     Heart sounds: Normal heart sounds.  Pulmonary:     Effort: Pulmonary effort is normal. No respiratory distress.     Breath sounds: Normal breath sounds.  Abdominal:     General: Abdomen is flat. Bowel sounds are normal.     Palpations: Abdomen is soft.     Tenderness: There is no abdominal tenderness. There is right CVA tenderness.  Neurological:     Mental Status: She is alert and oriented to person, place, and time.  Psychiatric:        Mood and Affect: Mood normal.        Behavior: Behavior normal.      Health Maintenance Due  Topic Date Due   HEMOGLOBIN A1C  Never done   FOOT EXAM  Never done   HIV Screening  Never done   Diabetic kidney evaluation - Urine ACR  Never done   Hepatitis C Screening  Never done   Zoster Vaccines- Shingrix (1 of 2) Never done   PAP SMEAR-Modifier  Never done   COLONOSCOPY (Pts 45-47yrs Insurance coverage will need to be confirmed)  Never done   COVID-19 Vaccine (3 - Mixed Product risk series) 04/21/2019   Diabetic kidney evaluation - eGFR measurement  06/04/2020   OPHTHALMOLOGY EXAM  12/13/2020   Pneumonia Vaccine 41+ Years old (2 of 2 - PPSV23 or PCV20) 09/28/2021   DEXA SCAN  Never done    There are no preventive care reminders to display for this patient.   No results found for: "TSH" Lab Results  Component Value Date   WBC 5.7 06/05/2019   HGB 15.0 06/05/2019   HCT 47.4 (H) 06/05/2019   MCV 87.6 06/05/2019   PLT 272 06/05/2019   Lab Results  Component Value Date   NA 139 06/05/2019   K 3.6 06/05/2019   CO2 27 06/05/2019   GLUCOSE 100 (H) 06/05/2019   BUN 23 06/05/2019   CREATININE 0.65 06/05/2019   CALCIUM 9.6 06/05/2019   ANIONGAP 12 06/05/2019   No results found for: "CHOL" No results found for: "HDL" No results found for: "LDLCALC" No results found for: "TRIG" No results found for: "CHOLHDL" No results found for: "HGBA1C"     Assessment & Plan:  Acute cystitis with hematuria Assessment & Plan: Checked UA. Ordered Urine culture. Sent Augmentin. Rocephin 1g IM #1 at the office.  Orders: -     POCT URINALYSIS DIP (CLINITEK) -  Urine Culture -     cefTRIAXone Sodium -     Amoxicillin-Pot Clavulanate; Take 1 tablet by mouth 3 (three) times daily.  Dispense: 21 tablet; Refill: 0     Meds ordered this encounter  Medications   cefTRIAXone (ROCEPHIN) injection 1 g   DISCONTD: amoxicillin-clavulanate (AUGMENTIN) 500-125 MG tablet    Sig: Take 1 tablet by mouth 3 (three) times daily.    Dispense:  21 tablet     Refill:  0   amoxicillin-clavulanate (AUGMENTIN) 500-125 MG tablet    Sig: Take 1 tablet by mouth 3 (three) times daily.    Dispense:  21 tablet    Refill:  0    Orders Placed This Encounter  Procedures   Urine Culture   POCT URINALYSIS DIP (CLINITEK)     Follow-up: No follow-ups on file.  An After Visit Summary was printed and given to the patient.   Geralynn Ochs I Leal-Borjas,acting as a scribe for Rochel Brome, MD.,have documented all relevant documentation on the behalf of Rochel Brome, MD,as directed by  Rochel Brome, MD while in the presence of Rochel Brome, MD.   I attest that I have reviewed this visit and agree with the plan scribed by my staff.   Rochel Brome, MD [facility] [phone]

## 2022-05-08 LAB — URINE CULTURE: Organism ID, Bacteria: NO GROWTH

## 2022-05-11 NOTE — Assessment & Plan Note (Signed)
Checked UA. Ordered Urine culture. Sent Augmentin. Rocephin 1g IM #1 at the office.

## 2022-05-14 ENCOUNTER — Other Ambulatory Visit (HOSPITAL_COMMUNITY): Payer: Self-pay

## 2022-05-14 ENCOUNTER — Other Ambulatory Visit: Payer: Self-pay

## 2022-05-14 MED ORDER — CYANOCOBALAMIN 1000 MCG/ML IJ SOLN
INTRAMUSCULAR | 3 refills | Status: DC
  Filled 2022-05-14 (×2): qty 3, 90d supply, fill #0
  Filled 2022-08-31: qty 3, 90d supply, fill #1
  Filled 2022-11-21: qty 3, 90d supply, fill #2

## 2022-05-15 ENCOUNTER — Other Ambulatory Visit: Payer: Self-pay

## 2022-05-16 ENCOUNTER — Other Ambulatory Visit (HOSPITAL_COMMUNITY): Payer: Self-pay

## 2022-05-16 ENCOUNTER — Encounter (HOSPITAL_COMMUNITY): Payer: Self-pay

## 2022-05-21 ENCOUNTER — Other Ambulatory Visit: Payer: Self-pay

## 2022-05-22 ENCOUNTER — Other Ambulatory Visit (HOSPITAL_COMMUNITY): Payer: Self-pay

## 2022-05-23 ENCOUNTER — Other Ambulatory Visit: Payer: Self-pay | Admitting: Family Medicine

## 2022-05-23 MED ORDER — FLUCONAZOLE 150 MG PO TABS: 150.0000 mg | ORAL_TABLET | Freq: Every day | ORAL | 1 refills | Status: DC

## 2022-06-07 ENCOUNTER — Other Ambulatory Visit: Payer: Self-pay

## 2022-06-08 ENCOUNTER — Ambulatory Visit: Payer: Commercial Managed Care - PPO | Admitting: Family Medicine

## 2022-06-08 VITALS — BP 118/82 | HR 97 | Temp 96.8°F | Resp 16 | Ht 63.0 in

## 2022-06-08 DIAGNOSIS — L821 Other seborrheic keratosis: Secondary | ICD-10-CM

## 2022-06-08 NOTE — Progress Notes (Unsigned)
Acute Office Visit  Subjective:    Patient ID: Amanda Haas, female    DOB: 07-May-1955, 67 y.o.   MRN: [id number]  No chief complaint on file.   HPI: Patient is in today for skin lesions on left arm, raised scaling areas, presents for several weeks  Past Medical History:  Diagnosis Date   Arthritis    knees   Breast cancer ([facility]) 2016   Right Breast Cancer   Encounter for long-term (current) use of high-risk medication 10/19/2015   Essential hypertension 10/19/2015   Gastroesophageal reflux disease 10/19/2015   GERD (gastroesophageal reflux disease)    History of breast cancer 10/19/2015   Formatting of this note might be different from the original. 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 Follows with Dr. Gilman Buttner, diagnosed in 2016 started her tamoxifen and 2017 for a total of 5 years   Hypertension    Hypothyroidism    Hypothyroidism (acquired) 10/19/2015   Malaise and fatigue 06/18/2018   Meniere's disease 10/19/2015   Mild intermittent asthma 10/19/2015   Pain in right knee 09/18/2018   Personal history of radiation therapy 2016   Right Breast Cancer   PONV (postoperative nausea and vomiting)    Seasonal allergies    Vitamin B12 deficiency 12/17/2018   Vitamin D deficiency 10/19/2015    Past Surgical History:  Procedure Laterality Date   APPENDECTOMY     BREAST BIOPSY     BREAST EXCISIONAL BIOPSY     BREAST LUMPECTOMY Right 11/30/2014   revision-br ca   BREAST LUMPECTOMY WITH RADIOACTIVE SEED LOCALIZATION Right 11/17/2014   Procedure: RIGHT RADIOACTIVE SEED LUMPECTOMY;  Surgeon: Manus Rudd, MD;  Location: [facility];  Service: General;  Laterality: Right;   BREAST SURGERY Left    DILATION AND CURETTAGE OF UTERUS     KNEE ARTHROSCOPY Left    RE-EXCISION OF BREAST LUMPECTOMY Right 11/30/2014   Procedure: RE-EXCISION OF RIGHT  BREAST LUMPECTOMY;  Surgeon: Manus Rudd, MD;  Location: [facility];  Service: General;  Laterality: Right;    Family History  Problem Relation Age of Onset   Pancreatic cancer Mother    Heart disease Father    Colon cancer Father    Atrial fibrillation Father    Hypertension Sister    Breast cancer Paternal Aunt 81   Breast cancer Paternal Aunt 39   Stroke Maternal Grandmother    Diabetes Maternal Grandfather    Stroke Paternal Grandmother    Heart disease Paternal Grandfather    Hyperthyroidism Brother     Social History   Socioeconomic History   Marital status: Married    Spouse name: Not on file   Number of children: Not on file   Years of education: Not on file   Highest education level: Not on file  Occupational History   Not on file  Tobacco Use   Smoking status: Never   Smokeless tobacco: Never  Substance and Sexual Activity   Alcohol use: No   Drug use: No   Sexual activity: Not on file  Other Topics Concern   Not on file  Social History Narrative   Not on file   Social Determinants of Health   Financial Resource Strain: Low Risk  (04/16/2022)   Overall Financial Resource Strain (CARDIA)    Difficulty of Paying Living Expenses: Not hard at all  Food Insecurity: No Food  Insecurity (04/16/2022)   Hunger Vital Sign    Worried About Running Out of Food in the Last Year: Never true    Ran Out of Food in the Last Year: Never true  Transportation Needs: No Transportation Needs (04/16/2022)   PRAPARE - Administrator, Civil Service (Medical): No    Lack of Transportation (Non-Medical): No  Physical Activity: Inactive (04/16/2022)   Exercise Vital Sign    Days of Exercise per Week: 0 days    Minutes of Exercise per Session: 0 min  Stress: No Stress Concern Present (04/16/2022)   [employer] of Occupational Health - Occupational Stress Questionnaire    Feeling of Stress : Not at all  Social Connections: Moderately Integrated  (04/16/2022)   Social Connection and Isolation Panel [NHANES]    Frequency of Communication with Friends and Family: More than three times a week    Frequency of Social Gatherings with Friends and Family: More than three times a week    Attends Religious Services: More than 4 times per year    Active Member of [employer] or Organizations: No    Attends Banker Meetings: Never    Marital Status: Married  Catering manager Violence: Not At Risk (04/16/2022)   Humiliation, Afraid, Rape, and Kick questionnaire    Fear of Current or Ex-Partner: No    Emotionally Abused: No    Physically Abused: No    Sexually Abused: No    Outpatient Medications Prior to Visit  Medication Sig Dispense Refill   fluconazole (DIFLUCAN) 150 MG tablet Take 1 tablet (150 mg total) by mouth daily. 3 tablet 1   albuterol (PROVENTIL HFA;VENTOLIN HFA) 108 (90 BASE) MCG/ACT inhaler Inhale 1-2 puffs into the lungs every 6 (six) hours as needed for wheezing or shortness of breath.      amoxicillin-clavulanate (AUGMENTIN) 500-125 MG tablet Take 1 tablet by mouth 3 (three) times daily. 21 tablet 0   aspirin 81 MG chewable tablet Chew 81 mg by mouth daily.     celecoxib (CELEBREX) 100 MG capsule Take 1 capsule (100 mg total) by mouth daily. 90 capsule 3   Cholecalciferol (VITAMIN D3) 125 MCG (5000 UT) CAPS Take 5,000 Units by mouth daily.     clorazepate (TRANXENE) 3.75 MG tablet TAKE ONE TABLET UP TO THREE TIMES DAILY AS NEEDED 60 tablet 5   cyanocobalamin (VITAMIN B12) 1000 MCG/ML injection Inject 1 mL (1,000 mcg total) into the muscle every 30 days. 3 mL 3   hydrochlorothiazide (HYDRODIURIL) 25 MG tablet Take 1 tablet (25 mg total) by mouth daily. 90 tablet 3   levothyroxine (SYNTHROID) 100 MCG tablet Take 1 tablet (100 mcg total) by mouth daily. 90 tablet 3   nitrofurantoin, macrocrystal-monohydrate, (MACROBID) 100 MG capsule Take 1 capsule (100 mg total) by mouth 2 (two) times daily. 17 capsule 0   pantoprazole  (PROTONIX) 40 MG tablet Take 1 tablet (40 mg total) by mouth daily. 90 tablet 3   promethazine (PHENERGAN) 25 MG tablet Take 25 mg by mouth every 6 (six) hours as needed for nausea or vomiting.     promethazine (PHENERGAN) 25 MG tablet Take 1 tablet (25 mg total) by mouth every 6 (six) hours as needed for nausea. 90 tablet 2   Facility-Administered Medications Prior to Visit  Medication Dose Route Frequency Provider Last Rate Last Admin   triamcinolone acetonide (KENALOG-40) injection 80 mg  80 mg Intra-articular Once Blane Ohara, MD        Allergies  Allergen Reactions   Nsaids     Other reaction(s): other   Etodolac Rash    Other reaction(s): Not available, Other (See Comments) [facility] [id number] unknown     Review of Systems     Objective:        05/07/2022    9:32 AM 04/16/2022    4:20 PM 01/22/2022    1:36 PM  Vitals with BMI  Height     Weight 175 lbs 175 lbs 175 lbs  BMI 31.01 31.01 31.01  Systolic 122 134 914  Diastolic 78 86 80  Pulse 96  92    No data found.   Physical Exam  Health Maintenance Due  Topic Date Due   HEMOGLOBIN A1C  Never done   FOOT EXAM  Never done   HIV Screening  Never done   Diabetic kidney evaluation - Urine ACR  Never done   Hepatitis C Screening  Never done   Zoster Vaccines- Shingrix (1 of 2) Never done   PAP SMEAR-Modifier  Never done   COLONOSCOPY (Pts 45-68yrs Insurance coverage will need to be confirmed)  Never done   COVID-19 Vaccine (3 - Mixed Product risk series) 04/21/2019   Diabetic kidney evaluation - eGFR measurement  06/04/2020   OPHTHALMOLOGY EXAM  12/13/2020   Pneumonia Vaccine 81+ Years old (2 of 2 - PPSV23 or PCV20) 09/28/2021   DEXA SCAN  Never done    There are no preventive care reminders to display for this patient.   No results found for: "TSH" Lab Results  Component Value Date   WBC 5.7 06/05/2019   HGB 15.0 06/05/2019   HCT 47.4 (H) 06/05/2019   MCV 87.6 06/05/2019   PLT 272  06/05/2019   Lab Results  Component Value Date   NA 139 06/05/2019   K 3.6 06/05/2019   CO2 27 06/05/2019   GLUCOSE 100 (H) 06/05/2019   BUN 23 06/05/2019   CREATININE 0.65 06/05/2019   CALCIUM 9.6 06/05/2019   ANIONGAP 12 06/05/2019   No results found for: "CHOL" No results found for: "HDL" No results found for: "LDLCALC" No results found for: "TRIG" No results found for: "CHOLHDL" No results found for: "HGBA1C"     Assessment & Plan:  There are no diagnoses linked to this encounter.   No orders of the defined types were placed in this encounter.   No orders of the defined types were placed in this encounter.    Follow-up: No follow-ups on file.  An After Visit Summary was printed and given to the patient.  Blane Ohara, MD [facility] [phone]

## 2022-06-09 DIAGNOSIS — L821 Other seborrheic keratosis: Secondary | ICD-10-CM | POA: Insufficient documentation

## 2022-06-11 ENCOUNTER — Encounter: Payer: Self-pay | Admitting: Family Medicine

## 2022-06-11 NOTE — Assessment & Plan Note (Signed)
Cryotherapy x 2 lesions. 2 cycles. 6 secs each x 2.

## 2022-06-13 ENCOUNTER — Other Ambulatory Visit: Payer: Self-pay

## 2022-06-13 ENCOUNTER — Other Ambulatory Visit (HOSPITAL_COMMUNITY): Payer: Self-pay

## 2022-07-11 ENCOUNTER — Encounter: Payer: Self-pay | Admitting: Gastroenterology

## 2022-07-18 ENCOUNTER — Other Ambulatory Visit: Payer: Self-pay

## 2022-07-18 ENCOUNTER — Other Ambulatory Visit (HOSPITAL_COMMUNITY): Payer: Self-pay

## 2022-07-18 DIAGNOSIS — R5381 Other malaise: Secondary | ICD-10-CM | POA: Diagnosis not present

## 2022-07-18 DIAGNOSIS — J4520 Mild intermittent asthma, uncomplicated: Secondary | ICD-10-CM | POA: Diagnosis not present

## 2022-07-18 DIAGNOSIS — E782 Mixed hyperlipidemia: Secondary | ICD-10-CM | POA: Diagnosis not present

## 2022-07-18 DIAGNOSIS — E559 Vitamin D deficiency, unspecified: Secondary | ICD-10-CM | POA: Diagnosis not present

## 2022-07-18 DIAGNOSIS — R5383 Other fatigue: Secondary | ICD-10-CM | POA: Diagnosis not present

## 2022-07-18 DIAGNOSIS — I110 Hypertensive heart disease with heart failure: Secondary | ICD-10-CM | POA: Diagnosis not present

## 2022-07-18 DIAGNOSIS — H8109 Meniere's disease, unspecified ear: Secondary | ICD-10-CM | POA: Diagnosis not present

## 2022-07-18 DIAGNOSIS — R7303 Prediabetes: Secondary | ICD-10-CM | POA: Diagnosis not present

## 2022-07-18 DIAGNOSIS — Z79899 Other long term (current) drug therapy: Secondary | ICD-10-CM | POA: Diagnosis not present

## 2022-07-18 DIAGNOSIS — Z853 Personal history of malignant neoplasm of breast: Secondary | ICD-10-CM | POA: Diagnosis not present

## 2022-07-18 DIAGNOSIS — K219 Gastro-esophageal reflux disease without esophagitis: Secondary | ICD-10-CM | POA: Diagnosis not present

## 2022-07-18 DIAGNOSIS — E039 Hypothyroidism, unspecified: Secondary | ICD-10-CM | POA: Diagnosis not present

## 2022-07-18 DIAGNOSIS — I2510 Atherosclerotic heart disease of native coronary artery without angina pectoris: Secondary | ICD-10-CM | POA: Diagnosis not present

## 2022-07-18 MED ORDER — CLORAZEPATE DIPOTASSIUM 3.75 MG PO TABS
3.7500 mg | ORAL_TABLET | Freq: Three times a day (TID) | ORAL | 5 refills | Status: DC | PRN
  Filled 2022-07-18: qty 60, 20d supply, fill #0

## 2022-07-19 ENCOUNTER — Encounter: Payer: Self-pay | Admitting: Pharmacist

## 2022-07-19 ENCOUNTER — Other Ambulatory Visit (HOSPITAL_COMMUNITY): Payer: Self-pay

## 2022-07-19 ENCOUNTER — Other Ambulatory Visit: Payer: Self-pay

## 2022-07-23 ENCOUNTER — Other Ambulatory Visit (HOSPITAL_COMMUNITY): Payer: Self-pay

## 2022-07-23 MED ORDER — METFORMIN HCL ER 500 MG PO TB24
500.0000 mg | ORAL_TABLET | Freq: Every day | ORAL | 3 refills | Status: DC
  Filled 2022-07-23: qty 90, 90d supply, fill #0
  Filled 2022-10-17 – 2022-10-18 (×3): qty 90, 90d supply, fill #1

## 2022-07-24 ENCOUNTER — Other Ambulatory Visit (HOSPITAL_COMMUNITY): Payer: Self-pay

## 2022-07-25 ENCOUNTER — Ambulatory Visit (AMBULATORY_SURGERY_CENTER): Payer: Commercial Managed Care - PPO | Admitting: *Deleted

## 2022-07-25 ENCOUNTER — Telehealth: Payer: Self-pay | Admitting: *Deleted

## 2022-07-25 ENCOUNTER — Other Ambulatory Visit: Payer: Self-pay

## 2022-07-25 VITALS — Ht 63.0 in | Wt 180.0 lb

## 2022-07-25 DIAGNOSIS — Z1211 Encounter for screening for malignant neoplasm of colon: Secondary | ICD-10-CM

## 2022-07-25 DIAGNOSIS — Z800 Family history of malignant neoplasm of digestive organs: Secondary | ICD-10-CM

## 2022-07-25 MED ORDER — NA SULFATE-K SULFATE-MG SULF 17.5-3.13-1.6 GM/177ML PO SOLN
1.0000 | Freq: Once | ORAL | 0 refills | Status: AC
  Filled 2022-07-25: qty 354, 1d supply, fill #0

## 2022-07-25 MED ORDER — ONDANSETRON HCL 4 MG PO TABS
4.0000 mg | ORAL_TABLET | Freq: Three times a day (TID) | ORAL | 0 refills | Status: DC | PRN
  Filled 2022-07-25: qty 2, 1d supply, fill #0

## 2022-07-25 NOTE — Progress Notes (Signed)
Pt's name and DOB verified at the beginning of the pre-visit.  Pt denies any difficulty with ambulating,sitting, laying down or rolling side to side Gave both [facility] main # and MD on call # prior to instructions.  No egg or soy allergy known to patient  No issues known to pt with past sedation with any surgeries or proceduresother than PONV Pt denies having issues being intubated Pt has no issues moving head neck or swallowing No FH of Malignant Hyperthermia Pt is not on diet pills Pt is not on home 02  Pt is not on blood thinners  Pt denies issues with constipation  Pt has frequent issues with constipation RN instructed pt to use Miralax per bottles instructions a week before prep days. Pt states they will Pt is not on dialysis Pt denise any abnormal heart rhythms  Pt denies any upcoming cardiac testing Pt encouraged to use to use Singlecare or Goodrx to reduce cost  Patient's chart reviewed by Cathlyn Parsons CNRA prior to pre-visit and patient appropriate for the [facility].  Pre-visit completed and red dot placed by patient's name on their procedure day (on provider's schedule).  . Visit by phone Pt states weight is 180 lb Instructed pt why it is important to and  to call if they have any changes in health or new medications. Directed them to the # given and on instructions.   Pt states they will.  Instructions reviewed with pt and pt states understanding. Instructed to review again prior to procedure. Pt states they will.  Instructions sent by mail with coupon and by my chart

## 2022-07-25 NOTE — Telephone Encounter (Signed)
Attempt to reach pt for pre-visit. LM with call back #. Will attempt other numbers in profile

## 2022-07-25 NOTE — Addendum Note (Signed)
Addended by: Danton Sewer on: 07/25/2022 02:08 PM   Modules accepted: Orders

## 2022-08-02 ENCOUNTER — Encounter: Payer: Self-pay | Admitting: Gastroenterology

## 2022-08-17 ENCOUNTER — Encounter: Payer: Self-pay | Admitting: Gastroenterology

## 2022-08-17 ENCOUNTER — Ambulatory Visit (AMBULATORY_SURGERY_CENTER): Payer: Commercial Managed Care - PPO | Admitting: Gastroenterology

## 2022-08-17 VITALS — BP 133/66 | HR 81 | Temp 98.0°F | Resp 22 | Ht 63.0 in | Wt 180.0 lb

## 2022-08-17 DIAGNOSIS — Z800 Family history of malignant neoplasm of digestive organs: Secondary | ICD-10-CM | POA: Diagnosis not present

## 2022-08-17 DIAGNOSIS — Z1211 Encounter for screening for malignant neoplasm of colon: Secondary | ICD-10-CM | POA: Diagnosis not present

## 2022-08-17 DIAGNOSIS — Z79899 Other long term (current) drug therapy: Secondary | ICD-10-CM | POA: Diagnosis not present

## 2022-08-17 DIAGNOSIS — E039 Hypothyroidism, unspecified: Secondary | ICD-10-CM | POA: Diagnosis not present

## 2022-08-17 DIAGNOSIS — I10 Essential (primary) hypertension: Secondary | ICD-10-CM | POA: Diagnosis not present

## 2022-08-17 MED ORDER — SODIUM CHLORIDE 0.9 % IV SOLN: 500.0000 mL | Freq: Once | INTRAVENOUS | Status: DC

## 2022-08-17 NOTE — Op Note (Signed)
[facility] Patient Name: Amanda Haas Procedure Date: 08/17/2022 2:24 PM MRN: [id number] Endoscopist: Lynann Bologna , MD, [id number] Age: 67 Referring MD:  Date of Birth: 01/02/1956 Gender: Female Account #: [id number] Procedure:                Colonoscopy Indications:              Screening in patient at increased risk: Colorectal                            cancer in father at age 27. neg colon 10 yrs ago                            (Dr Charm Barges) Medicines:                Monitored Anesthesia Care Procedure:                Pre-Anesthesia Assessment:                           - Prior to the procedure, a History and Physical                            was performed, and patient medications and                            allergies were reviewed. The patient's tolerance of                            previous anesthesia was also reviewed. The risks                            and benefits of the procedure and the sedation                            options and risks were discussed with the patient.                            All questions were answered, and informed consent                            was obtained. Prior Anticoagulants: The patient has                            taken no anticoagulant or antiplatelet agents. ASA                            Grade Assessment: II - A patient with mild systemic                            disease. After reviewing the risks and benefits,                            the patient was deemed in satisfactory condition to  undergo the procedure.                           After obtaining informed consent, the colonoscope                            was passed under direct vision. Throughout the                            procedure, the patient's blood pressure, pulse, and                            oxygen saturations were monitored continuously. The                            Olympus Scope SN: [id number] was introduced  through                            the anus and advanced to the 2 cm into the ileum.                            The colonoscopy was performed without difficulty.                            The patient tolerated the procedure well. The                            quality of the bowel preparation was good. The                            terminal ileum, ileocecal valve, appendiceal                            orifice, and rectum were photographed. Scope In: 2:30:59 PM Scope Out: 2:42:42 PM Scope Withdrawal Time: 0 hours 7 minutes 31 seconds  Total Procedure Duration: 0 hours 11 minutes 43 seconds  Findings:                 A few small-mouthed diverticula were found in the                            sigmoid colon.                           Non-bleeding internal hemorrhoids were found during                            retroflexion. The hemorrhoids were small and Grade                            I (internal hemorrhoids that do not prolapse).                           The terminal ileum appeared normal.  The exam was otherwise without abnormality on                            direct and retroflexion views. Complications:            No immediate complications. Estimated Blood Loss:     Estimated blood loss: none. Impression:               - Mild sigmoid diverticulosis.                           - Non-bleeding internal hemorrhoids.                           - The examined portion of the ileum was normal.                           - The examination was otherwise normal on direct                            and retroflexion views.                           - No specimens collected. Recommendation:           - Patient has a contact number available for                            emergencies. The signs and symptoms of potential                            delayed complications were discussed with the                            patient. Return to normal activities tomorrow.                             Written discharge instructions were provided to the                            patient.                           - Resume previous diet.                           - Continue present medications.                           - Repeat colonoscopy in 10 years for screening                            purposes. Earlier, if with any new problems or                            change in family history.                           -  The findings and recommendations were discussed                            with the patient's family. Lynann Bologna, MD 08/17/2022 2:45:46 PM This report has been signed electronically.

## 2022-08-17 NOTE — Progress Notes (Signed)
Vss nad trans to pacu 

## 2022-08-17 NOTE — Progress Notes (Signed)
Pt's states no medical or surgical changes since previsit or office visit. 

## 2022-08-17 NOTE — Patient Instructions (Signed)
Handout on diverticulosis given.    YOU HAD AN ENDOSCOPIC PROCEDURE TODAY AT THE [facility]:   Refer to the procedure report that was given to you for any specific questions about what was found during the examination.  If the procedure report does not answer your questions, please call your gastroenterologist to clarify.  If you requested that your care partner not be given the details of your procedure findings, then the procedure report has been included in a sealed envelope for you to review at your convenience later.  YOU SHOULD EXPECT: Some feelings of bloating in the abdomen. Passage of more gas than usual.  Walking can help get rid of the air that was put into your GI tract during the procedure and reduce the bloating. If you had a lower endoscopy (such as a colonoscopy or flexible sigmoidoscopy) you may notice spotting of blood in your stool or on the toilet paper. If you underwent a bowel prep for your procedure, you may not have a normal bowel movement for a few days.  Please Note:  You might notice some irritation and congestion in your nose or some drainage.  This is from the oxygen used during your procedure.  There is no need for concern and it should clear up in a day or so.  SYMPTOMS TO REPORT IMMEDIATELY:  Following lower endoscopy (colonoscopy or flexible sigmoidoscopy):  Excessive amounts of blood in the stool  Significant tenderness or worsening of abdominal pains  Swelling of the abdomen that is new, acute  Fever of 100F or higher   For urgent or emergent issues, a gastroenterologist can be reached at any hour by calling [phone]. Do not use MyChart messaging for urgent concerns.    DIET:  We do recommend a small meal at first, but then you may proceed to your regular diet.  Drink plenty of fluids but you should avoid alcoholic beverages for 24 hours.  ACTIVITY:  You should plan to take it easy for the rest of today and you should NOT DRIVE or use  heavy machinery until tomorrow (because of the sedation medicines used during the test).    FOLLOW UP: Our staff will call the number listed on your records the next business day following your procedure.  We will call around 7:15- 8:00 am to check on you and address any questions or concerns that you may have regarding the information given to you following your procedure. If we do not reach you, we will leave a message.     If any biopsies were taken you will be contacted by phone or by letter within the next 1-3 weeks.  Please call us at [phone] if you have not heard about the biopsies in 3 weeks.    SIGNATURES/CONFIDENTIALITY: You and/or your care partner have signed paperwork which will be entered into your electronic medical record.  These signatures attest to the fact that that the information above on your After Visit Summary has been reviewed and is understood.  Full responsibility of the confidentiality of this discharge information lies with you and/or your care-partner.  

## 2022-08-17 NOTE — Progress Notes (Signed)
[facility] History and Physical   Primary Care Physician:  Krystal Clark, NP   Reason for Procedure:   FH CRC (dad) at age 67  Plan:    colon     HPI: Amanda Haas is a 67 y.o. female    Past Medical History:  Diagnosis Date   Arthritis    knees   Breast cancer (HCC) 2016   Right Breast Cancer   Encounter for long-term (current) use of high-risk medication 10/19/2015   Essential hypertension 10/19/2015   Gastroesophageal reflux disease 10/19/2015   GERD (gastroesophageal reflux disease)    History of breast cancer 10/19/2015   Formatting of this note might be different from the original. 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 Follows with Dr. Gilman Buttner, diagnosed in 2016 started her tamoxifen and 2017 for a total of 5 years   Hypertension    Hypothyroidism    Hypothyroidism (acquired) 10/19/2015   Malaise and fatigue 06/18/2018   Meniere's disease 10/19/2015   Mild intermittent asthma 10/19/2015   Pain in right knee 09/18/2018   Personal history of radiation therapy 2016   Right Breast Cancer   PONV (postoperative nausea and vomiting)    Seasonal allergies    Vitamin B12 deficiency 12/17/2018   Vitamin D deficiency 10/19/2015    Past Surgical History:  Procedure Laterality Date   APPENDECTOMY     BREAST BIOPSY     BREAST EXCISIONAL BIOPSY     BREAST LUMPECTOMY Right 11/30/2014   revision-br ca   BREAST LUMPECTOMY WITH RADIOACTIVE SEED LOCALIZATION Right 11/17/2014   Procedure: RIGHT RADIOACTIVE SEED LUMPECTOMY;  Surgeon: Manus Rudd, MD;  Location: [facility];  Service: General;  Laterality: Right;   BREAST SURGERY Left    DILATION AND CURETTAGE OF UTERUS     KNEE ARTHROSCOPY Left    RE-EXCISION OF BREAST LUMPECTOMY Right 11/30/2014   Procedure: RE-EXCISION OF RIGHT BREAST LUMPECTOMY;  Surgeon:  Manus Rudd, MD;  Location: [facility];  Service: General;  Laterality: Right;    Prior to Admission medications   Medication Sig Start Date End Date Taking? Authorizing Provider  clorazepate (TRANXENE) 3.75 MG tablet TAKE ONE TABLET UP TO THREE TIMES DAILY AS NEEDED 07/12/21  Yes   hydrochlorothiazide (HYDRODIURIL) 25 MG tablet Take 1 tablet (25 mg total) by mouth daily. Patient taking differently: Take 12.5 mg by mouth daily. 01/17/22  Yes   levothyroxine (SYNTHROID) 100 MCG tablet Take 1 tablet (100 mcg total) by mouth daily. 01/17/22  Yes   metFORMIN (GLUCOPHAGE-XR) 500 MG 24 hr tablet Take 1 tablet (500 mg total) by mouth daily with breakfast. 07/23/22  Yes   ondansetron (ZOFRAN) 4 MG tablet Take 1 tablet (4 mg total) by mouth every 8 (eight) hours as needed for nausea or vomiting. 07/25/22  Yes Lynann Bologna, MD  pantoprazole (PROTONIX) 40 MG tablet Take 1 tablet (40 mg total) by mouth daily. 01/17/22  Yes   albuterol (PROVENTIL HFA;VENTOLIN HFA) 108 (90 BASE) MCG/ACT inhaler Inhale 1-2 puffs into the lungs every 6 (six) hours as needed for wheezing or shortness of breath.     [provider]  aspirin 81 MG chewable tablet Chew 81 mg by mouth daily.    [provider]  celecoxib (CELEBREX) 100 MG capsule Take 1 capsule (100 mg total) by mouth daily. 01/17/22     Cholecalciferol (VITAMIN D3)  125 MCG (5000 UT) CAPS Take 5,000 Units by mouth daily.    [provider]  cyanocobalamin (VITAMIN B12) 1000 MCG/ML injection Inject 1 mL (1,000 mcg total) into the muscle every 30 days. 05/14/22     promethazine (PHENERGAN) 25 MG tablet Take 25 mg by mouth every 6 (six) hours as needed for nausea or vomiting.    [provider]  promethazine (PHENERGAN) 25 MG tablet Take 1 tablet (25 mg total) by mouth every 6 (six) hours as needed for nausea. 01/17/22       Current Outpatient Medications  Medication Sig Dispense Refill   clorazepate (TRANXENE) 3.75 MG  tablet TAKE ONE TABLET UP TO THREE TIMES DAILY AS NEEDED 60 tablet 5   hydrochlorothiazide (HYDRODIURIL) 25 MG tablet Take 1 tablet (25 mg total) by mouth daily. (Patient taking differently: Take 12.5 mg by mouth daily.) 90 tablet 3   levothyroxine (SYNTHROID) 100 MCG tablet Take 1 tablet (100 mcg total) by mouth daily. 90 tablet 3   metFORMIN (GLUCOPHAGE-XR) 500 MG 24 hr tablet Take 1 tablet (500 mg total) by mouth daily with breakfast. 90 tablet 3   ondansetron (ZOFRAN) 4 MG tablet Take 1 tablet (4 mg total) by mouth every 8 (eight) hours as needed for nausea or vomiting. 2 tablet 0   pantoprazole (PROTONIX) 40 MG tablet Take 1 tablet (40 mg total) by mouth daily. 90 tablet 3   albuterol (PROVENTIL HFA;VENTOLIN HFA) 108 (90 BASE) MCG/ACT inhaler Inhale 1-2 puffs into the lungs every 6 (six) hours as needed for wheezing or shortness of breath.      aspirin 81 MG chewable tablet Chew 81 mg by mouth daily.     celecoxib (CELEBREX) 100 MG capsule Take 1 capsule (100 mg total) by mouth daily. 90 capsule 3   Cholecalciferol (VITAMIN D3) 125 MCG (5000 UT) CAPS Take 5,000 Units by mouth daily.     cyanocobalamin (VITAMIN B12) 1000 MCG/ML injection Inject 1 mL (1,000 mcg total) into the muscle every 30 days. 3 mL 3   promethazine (PHENERGAN) 25 MG tablet Take 25 mg by mouth every 6 (six) hours as needed for nausea or vomiting.     promethazine (PHENERGAN) 25 MG tablet Take 1 tablet (25 mg total) by mouth every 6 (six) hours as needed for nausea. 90 tablet 2   Current Facility-Administered Medications  Medication Dose Route Frequency Provider Last Rate Last Admin   0.9 %  sodium chloride infusion  500 mL Intravenous Once Lynann Bologna, MD        Allergies as of 08/17/2022 - Review Complete 08/17/2022  Allergen Reaction Noted   Nsaids  12/06/2021   Etodolac Rash 11/12/2014    Family History  Problem Relation Age of Onset   Pancreatic cancer Mother    Heart disease Father    Colon cancer Father     Atrial fibrillation Father    Hypertension Sister    Hyperthyroidism Brother    Breast cancer Paternal Aunt 43   Breast cancer Paternal Aunt 19   Stomach cancer Maternal Grandmother    Stroke Maternal Grandmother    Diabetes Maternal Grandfather    Stroke Paternal Grandmother    Heart disease Paternal Grandfather    Colon polyps Neg Hx    Esophageal cancer Neg Hx    Rectal cancer Neg Hx     Social History   Socioeconomic History   Marital status: Married    Spouse name: Not on file   Number of children: Not  on file   Years of education: Not on file   Highest education level: Not on file  Occupational History   Not on file  Tobacco Use   Smoking status: Never   Smokeless tobacco: Never  Vaping Use   Vaping Use: Never used  Substance and Sexual Activity   Alcohol use: No   Drug use: No   Sexual activity: Not on file  Other Topics Concern   Not on file  Social History Narrative   Not on file   Social Determinants of Health   Financial Resource Strain: Low Risk  (04/16/2022)   Overall Financial Resource Strain (CARDIA)    Difficulty of Paying Living Expenses: Not hard at all  Food Insecurity: No Food Insecurity (04/16/2022)   Hunger Vital Sign    Worried About Running Out of Food in the Last Year: Never true    Ran Out of Food in the Last Year: Never true  Transportation Needs: No Transportation Needs (04/16/2022)   PRAPARE - Administrator, Civil Service (Medical): No    Lack of Transportation (Non-Medical): No  Physical Activity: Inactive (04/16/2022)   Exercise Vital Sign    Days of Exercise per Week: 0 days    Minutes of Exercise per Session: 0 min  Stress: No Stress Concern Present (04/16/2022)   [employer] of Occupational Health - Occupational Stress Questionnaire    Feeling of Stress : Not at all  Social Connections: Moderately Integrated (04/16/2022)   Social Connection and Isolation Panel [NHANES]    Frequency of Communication with  Friends and Family: More than three times a week    Frequency of Social Gatherings with Friends and Family: More than three times a week    Attends Religious Services: More than 4 times per year    Active Member of [employer] or Organizations: No    Attends Banker Meetings: Never    Marital Status: Married  Catering manager Violence: Not At Risk (04/16/2022)   Humiliation, Afraid, Rape, and Kick questionnaire    Fear of Current or Ex-Partner: No    Emotionally Abused: No    Physically Abused: No    Sexually Abused: No    Review of Systems: Positive for none All other review of systems negative except as mentioned in the HPI.  Physical Exam: Vital signs in last 24 hours: @VSRANGES @   General:   Alert,  Well-developed, well-nourished, pleasant and cooperative in NAD Lungs:  Clear throughout to auscultation.   Heart:  Regular rate and rhythm; no murmurs, clicks, rubs,  or gallops. Abdomen:  Soft, nontender and nondistended. Normal bowel sounds.   Neuro/Psych:  Alert and cooperative. Normal mood and affect. A and O x 3    No significant changes were identified.  The patient continues to be an appropriate candidate for the planned procedure and anesthesia.   Edman Circle, MD. [facility] 08/17/2022 2:23 PM@

## 2022-08-20 ENCOUNTER — Telehealth: Payer: Self-pay

## 2022-08-20 NOTE — Telephone Encounter (Signed)
  Follow up Call-     08/17/2022    1:55 PM  Call back number  Post procedure Call Back phone  # [phone]  Permission to leave phone message Yes     Patient questions:  Do you have a fever, pain , or abdominal swelling? No. Pain Score  0 *  Have you tolerated food without any problems? Yes.    Have you been able to return to your normal activities? Yes.    Do you have any questions about your discharge instructions: Diet   No. Medications  No. Follow up visit  No.  Do you have questions or concerns about your Care? No.  Actions: * If pain score is 4 or above: No action needed, pain <4.

## 2022-08-31 ENCOUNTER — Other Ambulatory Visit (HOSPITAL_COMMUNITY): Payer: Self-pay

## 2022-09-03 ENCOUNTER — Other Ambulatory Visit (HOSPITAL_COMMUNITY): Payer: Self-pay

## 2022-09-04 ENCOUNTER — Other Ambulatory Visit (HOSPITAL_COMMUNITY): Payer: Self-pay

## 2022-09-05 ENCOUNTER — Other Ambulatory Visit (HOSPITAL_COMMUNITY): Payer: Self-pay

## 2022-09-06 ENCOUNTER — Other Ambulatory Visit (HOSPITAL_COMMUNITY): Payer: Self-pay

## 2022-09-06 MED ORDER — CLORAZEPATE DIPOTASSIUM 3.75 MG PO TABS
3.7500 mg | ORAL_TABLET | Freq: Three times a day (TID) | ORAL | 5 refills | Status: DC | PRN
  Filled 2022-09-06 – 2022-09-10 (×2): qty 60, 20d supply, fill #0

## 2022-09-10 ENCOUNTER — Other Ambulatory Visit (HOSPITAL_COMMUNITY): Payer: Self-pay

## 2022-09-10 ENCOUNTER — Other Ambulatory Visit: Payer: Self-pay

## 2022-09-11 ENCOUNTER — Other Ambulatory Visit (HOSPITAL_COMMUNITY): Payer: Self-pay

## 2022-09-23 ENCOUNTER — Other Ambulatory Visit (HOSPITAL_COMMUNITY): Payer: Self-pay

## 2022-09-24 ENCOUNTER — Other Ambulatory Visit: Payer: Self-pay

## 2022-09-24 ENCOUNTER — Other Ambulatory Visit (HOSPITAL_COMMUNITY): Payer: Self-pay

## 2022-10-17 ENCOUNTER — Other Ambulatory Visit (HOSPITAL_BASED_OUTPATIENT_CLINIC_OR_DEPARTMENT_OTHER): Payer: Self-pay

## 2022-10-17 ENCOUNTER — Other Ambulatory Visit (HOSPITAL_COMMUNITY): Payer: Self-pay

## 2022-10-18 ENCOUNTER — Other Ambulatory Visit: Payer: Self-pay

## 2022-11-15 ENCOUNTER — Ambulatory Visit (INDEPENDENT_AMBULATORY_CARE_PROVIDER_SITE_OTHER): Payer: Commercial Managed Care - PPO

## 2022-11-15 DIAGNOSIS — Z23 Encounter for immunization: Secondary | ICD-10-CM | POA: Diagnosis not present

## 2022-11-21 ENCOUNTER — Other Ambulatory Visit (HOSPITAL_COMMUNITY): Payer: Self-pay

## 2022-11-28 ENCOUNTER — Inpatient Hospital Stay
Admission: RE | Admit: 2022-11-28 | Discharge: 2022-11-28 | Disposition: A | Discharge status: Home / Self Care | Payer: Commercial Managed Care - PPO | Source: Ambulatory Visit | Attending: Oncology | Admitting: Oncology

## 2022-11-28 DIAGNOSIS — Z1231 Encounter for screening mammogram for malignant neoplasm of breast: Secondary | ICD-10-CM | POA: Diagnosis not present

## 2022-11-28 DIAGNOSIS — D0511 Intraductal carcinoma in situ of right breast: Secondary | ICD-10-CM

## 2022-11-29 NOTE — Progress Notes (Signed)
[facility]  [address] [place],  [place]  [address] [phone]  Clinic Day:  11/30/22  Referring physician: Rhea Bleacher*   CHIEF COMPLAINT:  CC: History of stage 0 hormone receptor positive ductal carcinoma in situ  Current Treatment:  Surveillance   HISTORY OF PRESENT ILLNESS:  Amanda Haas is a 67 y.o. 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  Calcifications were seen on routine mammogram.  She was treated with lumpectomy in September 2016.  Pathology revealed a 0.8 cm, intermediate grade, ductal carcinoma in situ.  Estrogen and progesterone receptors were positive.  She had a positive focal lateral margin, so underwent re-excision that October.  There was residual ductal carcinoma in situ, but the final margin was clear.  She received adjuvant radiation to the right breast, completed in December 2016.  She was placed on chemoprevention with raloxifene 60 mg daily in January 2017.  She has multiple comorbidities including diabetes, hypothyroidism, hypertension, hyperlipidemia, and peptic ulcer disease.  Bilateral diagnostic mammogram in August 2019 did not reveal any evidence of malignancy.  Review of her family history reveals a paternal aunt had breast cancer at age 47, her mother had pancreatic cancer at age 67 and her father was diagnosed with colon cancer at age 66. She completed 5 years of endocrine therapy with raloxifene in January 2022.  Annual screening bilateral mammogram in October 2023 revealed a possible mass in the left breast. She then had a diagnostic mammogram along with an ultrasound. The revealed an oval circumscribed low density mass measuring 5-6 mm and U/S confirmed an anechoic benign cyst.  Bilateral screening mammogram in 1 year was recommended.  INTERVAL HISTORY:  Amanda Haas is here for annual follow up.  She  continues to do well and denied complaint.  She denies any changes in her breasts. She denies fevers or chills.  She reports bilateral pain, which is chronic.  Her appetite is good.  Her weight has increased 7 pounds in the last year.  Bilateral screening mammogram on October 19 did not reveal any evidence of malignancy..  She has routine labs at her PCP. She states screening colonoscopy and bone density scan are due this year.  REVIEW OF SYSTEMS:  Review of Systems  Constitutional:  Negative for appetite change, chills, fatigue, fever and unexpected weight change.  HENT:   Negative for lump/mass, mouth sores and sore throat.   Respiratory:  Negative for cough and shortness of breath.   Cardiovascular:  Negative for chest pain and leg swelling.  Gastrointestinal:  Negative for abdominal pain, constipation, diarrhea, nausea and vomiting.  Endocrine: Negative for hot flashes.  Genitourinary:  Negative for difficulty urinating, dysuria, frequency and hematuria.   Musculoskeletal:  Positive for arthralgias. Negative for back pain and myalgias.  Skin:  Negative for rash.  Neurological:  Negative for dizziness and headaches.  Hematological:  Negative for adenopathy. Does not bruise/bleed easily.  Psychiatric/Behavioral:  Negative for depression and sleep disturbance. The patient is not nervous/anxious.      VITALS:  Blood pressure (!) 149/87, pulse (!) 101, temperature 97.6 F (36.4 C), temperature source Oral, resp. rate 16, height 5\' 3"  (1.6 m), weight 187 lb 3.2 oz (84.9 kg), SpO2 95%.  Wt Readings from Last 3 Encounters:  11/30/22 187 lb 3.2 oz (84.9 kg)  08/17/22 180 lb (81.6 kg)  07/25/22 180 lb (81.6 kg)  Body mass index is 33.16 kg/m.  Performance status (ECOG): 0 - Asymptomatic  PHYSICAL EXAM:  Physical Exam Vitals and nursing note reviewed.  Constitutional:      General: She is not in acute distress.    Appearance: Normal appearance.  HENT:     Head: Normocephalic and  atraumatic.     Mouth/Throat:     Mouth: Mucous membranes are moist.     Pharynx: Oropharynx is clear. No oropharyngeal exudate or posterior oropharyngeal erythema.  Eyes:     General: No scleral icterus.    Extraocular Movements: Extraocular movements intact.     Conjunctiva/sclera: Conjunctivae normal.     Pupils: Pupils are equal, round, and reactive to light.  Cardiovascular:     Rate and Rhythm: Normal rate and regular rhythm.     Heart sounds: Normal heart sounds. No murmur heard.    No friction rub. No gallop.  Pulmonary:     Effort: Pulmonary effort is normal.     Breath sounds: Normal breath sounds. No wheezing, rhonchi or rales.  Chest:  Breasts:    Right: Normal.     Left: Normal.  Abdominal:     General: There is no distension.     Palpations: Abdomen is soft. There is no hepatomegaly, splenomegaly or mass.     Tenderness: There is no abdominal tenderness.  Musculoskeletal:        General: Normal range of motion.     Cervical back: Normal range of motion and neck supple. No tenderness.     Right lower leg: No edema.     Left lower leg: No edema.  Lymphadenopathy:     Cervical: No cervical adenopathy.     Upper Body:     Right upper body: No supraclavicular or axillary adenopathy.     Left upper body: No supraclavicular or axillary adenopathy.     Lower Body: No right inguinal adenopathy. No left inguinal adenopathy.  Skin:    General: Skin is warm and dry.     Coloration: Skin is not jaundiced.     Findings: No rash.  Neurological:     Mental Status: She is alert and oriented to person, place, and time.     Cranial Nerves: No cranial nerve deficit.  Psychiatric:        Mood and Affect: Mood normal.        Behavior: Behavior normal.        Thought Content: Thought content normal.     LABS:      Latest Ref Rng & Units 06/05/2019   12:28 PM  CBC  WBC 4.0 - 10.5 K/uL 5.7   Hemoglobin 12.0 - 15.0 g/dL 09.6   Hematocrit 04.5 - 46.0 % 47.4   Platelets 150  - 400 K/uL 272       Latest Ref Rng & Units 06/05/2019   12:28 PM  CMP  Glucose 70 - 99 mg/dL 409   BUN 8 - 23 mg/dL 23   Creatinine 8.11 - 1.00 mg/dL 9.14   Sodium 782 - 956 mmol/L 139   Potassium 3.5 - 5.1 mmol/L 3.6   Chloride 98 - 111 mmol/L 100   CO2 22 - 32 mmol/L 27   Calcium 8.9 - 10.3 mg/dL 9.6     STUDIES:  No results found.   HISTORY:   Allergies:  Allergies  Allergen Reactions   Nsaids     Lodine only can take all other NSAIDS   Etodolac Rash  Other reaction(s): Not available, Other (See Comments) NDC [id number] unknown     Current Medications: Current Outpatient Medications  Medication Sig Dispense Refill   albuterol (PROVENTIL HFA;VENTOLIN HFA) 108 (90 BASE) MCG/ACT inhaler Inhale 1-2 puffs into the lungs every 6 (six) hours as needed for wheezing or shortness of breath.      aspirin 81 MG chewable tablet Chew 81 mg by mouth daily.     celecoxib (CELEBREX) 100 MG capsule Take 1 capsule (100 mg total) by mouth daily. 90 capsule 3   Cholecalciferol (VITAMIN D3) 125 MCG (5000 UT) CAPS Take 5,000 Units by mouth daily.     clorazepate (TRANXENE) 3.75 MG tablet Take 1 tablet (3.75 mg total) by mouth up to 3 (three) times daily as needed. 60 tablet 5   cyanocobalamin (VITAMIN B12) 1000 MCG/ML injection Inject 1 mL (1,000 mcg total) into the muscle every 30 days. 3 mL 3   hydrochlorothiazide (HYDRODIURIL) 25 MG tablet Take 1 tablet (25 mg total) by mouth daily. (Patient taking differently: Take 12.5 mg by mouth daily.) 90 tablet 3   levothyroxine (SYNTHROID) 100 MCG tablet Take 1 tablet (100 mcg total) by mouth daily. 90 tablet 3   metFORMIN (GLUCOPHAGE-XR) 500 MG 24 hr tablet Take 1 tablet (500 mg total) by mouth daily with breakfast. 90 tablet 3   pantoprazole (PROTONIX) 40 MG tablet Take 1 tablet (40 mg total) by mouth daily. 90 tablet 3   promethazine (PHENERGAN) 25 MG tablet Take 25 mg by mouth every 6 (six) hours as needed for nausea or vomiting.      promethazine (PHENERGAN) 25 MG tablet Take 1 tablet (25 mg total) by mouth every 6 (six) hours as needed for nausea. 90 tablet 2   No current facility-administered medications for this visit.     ASSESSMENT & PLAN:   Assessment/Plan:  Amanda Haas is a 67 y.o. female with ductal carcinoma in situ of the breast diagnosed in August 2016.  She completed 5 years of chemoprevention with raloxifene in January 2022. She remains without evidence of recurrence.  We will plan to see her back in 1 year with bilateral mammogram for reevaluation.  The patient understands the plans discussed today and is in agreement with them.  She knows to contact our office if she develops concerns regarding her breast cancer or its treatment.   I provided 15 minutes of face-to-face time during this this encounter and > 50% was spent counseling as documented under my assessment and plan.    Adah Perl, PA-C [facility] AT [facility] [address] [place] [address] Dept: [phone] Dept Fax: [phone]

## 2022-11-30 ENCOUNTER — Inpatient Hospital Stay: Payer: Commercial Managed Care - PPO | Attending: Hematology and Oncology | Admitting: Hematology and Oncology

## 2022-11-30 VITALS — BP 149/87 | HR 101 | Temp 97.6°F | Resp 16 | Ht 63.0 in | Wt 187.2 lb

## 2022-11-30 DIAGNOSIS — D0511 Intraductal carcinoma in situ of right breast: Secondary | ICD-10-CM | POA: Diagnosis not present

## 2022-12-03 ENCOUNTER — Encounter: Payer: Self-pay | Admitting: Hematology and Oncology

## 2022-12-15 ENCOUNTER — Other Ambulatory Visit (HOSPITAL_COMMUNITY): Payer: Self-pay

## 2022-12-17 ENCOUNTER — Other Ambulatory Visit (HOSPITAL_COMMUNITY): Payer: Self-pay

## 2023-05-14 ENCOUNTER — Other Ambulatory Visit (HOSPITAL_COMMUNITY): Payer: Self-pay

## 2023-06-20 ENCOUNTER — Other Ambulatory Visit: Payer: Self-pay | Admitting: Hematology and Oncology

## 2023-06-20 DIAGNOSIS — Z1231 Encounter for screening mammogram for malignant neoplasm of breast: Secondary | ICD-10-CM

## 2023-06-20 DIAGNOSIS — D0511 Intraductal carcinoma in situ of right breast: Secondary | ICD-10-CM

## 2023-08-28 ENCOUNTER — Other Ambulatory Visit (HOSPITAL_BASED_OUTPATIENT_CLINIC_OR_DEPARTMENT_OTHER): Payer: Self-pay | Admitting: Specialist

## 2023-08-28 DIAGNOSIS — M8580 Other specified disorders of bone density and structure, unspecified site: Secondary | ICD-10-CM

## 2023-11-29 ENCOUNTER — Encounter (HOSPITAL_BASED_OUTPATIENT_CLINIC_OR_DEPARTMENT_OTHER): Payer: Self-pay | Admitting: Radiology

## 2023-11-29 ENCOUNTER — Ambulatory Visit (HOSPITAL_BASED_OUTPATIENT_CLINIC_OR_DEPARTMENT_OTHER)
Admission: RE | Admit: 2023-11-29 | Discharge: 2023-11-29 | Disposition: A | Discharge status: Home / Self Care | Source: Ambulatory Visit | Attending: Hematology and Oncology | Admitting: Hematology and Oncology

## 2023-11-29 ENCOUNTER — Ambulatory Visit (HOSPITAL_BASED_OUTPATIENT_CLINIC_OR_DEPARTMENT_OTHER)
Admission: RE | Admit: 2023-11-29 | Discharge: 2023-11-29 | Disposition: A | Discharge status: Home / Self Care | Source: Ambulatory Visit | Attending: Specialist | Admitting: Specialist

## 2023-11-29 DIAGNOSIS — M8580 Other specified disorders of bone density and structure, unspecified site: Secondary | ICD-10-CM

## 2023-11-29 DIAGNOSIS — D0511 Intraductal carcinoma in situ of right breast: Secondary | ICD-10-CM | POA: Diagnosis not present

## 2023-11-29 DIAGNOSIS — Z1231 Encounter for screening mammogram for malignant neoplasm of breast: Secondary | ICD-10-CM

## 2023-12-04 ENCOUNTER — Telehealth: Payer: Self-pay

## 2023-12-04 NOTE — Telephone Encounter (Signed)
-----   Message from Andrez DELENA Foy sent at 12/04/2023  6:40 AM EDT ----- Please let her know her mammogram was negative, thanks

## 2023-12-04 NOTE — Telephone Encounter (Signed)
 Patient informed, advised Dr. Cornelius will probably go over it tomorrow.

## 2023-12-05 ENCOUNTER — Encounter: Payer: Self-pay | Admitting: Oncology

## 2023-12-05 ENCOUNTER — Telehealth: Payer: Self-pay | Admitting: Oncology

## 2023-12-05 ENCOUNTER — Inpatient Hospital Stay: Payer: Self-pay | Attending: Oncology | Admitting: Oncology

## 2023-12-05 ENCOUNTER — Other Ambulatory Visit: Payer: Self-pay | Admitting: Oncology

## 2023-12-05 VITALS — BP 147/89 | HR 84 | Temp 97.5°F | Resp 16 | Ht 63.0 in | Wt 184.1 lb

## 2023-12-05 DIAGNOSIS — M8580 Other specified disorders of bone density and structure, unspecified site: Secondary | ICD-10-CM | POA: Diagnosis not present

## 2023-12-05 DIAGNOSIS — M85852 Other specified disorders of bone density and structure, left thigh: Secondary | ICD-10-CM | POA: Insufficient documentation

## 2023-12-05 DIAGNOSIS — Z86000 Personal history of in-situ neoplasm of breast: Secondary | ICD-10-CM | POA: Insufficient documentation

## 2023-12-05 DIAGNOSIS — D0511 Intraductal carcinoma in situ of right breast: Secondary | ICD-10-CM | POA: Diagnosis not present

## 2023-12-05 DIAGNOSIS — Z780 Asymptomatic menopausal state: Secondary | ICD-10-CM | POA: Diagnosis not present

## 2023-12-05 NOTE — Progress Notes (Signed)
 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  Calcifications were seen on routine mammogram.  She was treated with lumpectomy in September 2016.  Pathology revealed a 0.8 cm, intermediate grade, ductal carcinoma in situ.  Estrogen and progesterone receptors were positive.  She had a positive focal lateral margin, so underwent re-excision that October.  There was residual ductal carcinoma in situ, but the final margin was clear.  She received adjuvant radiation to the right breast, completed in December 2016.  She was placed on chemoprevention with raloxifene  60 mg daily in January 2017.  She has multiple comorbidities including diabetes, hypothyroidism, hypertension, hyperlipidemia, and peptic ulcer disease.  Bilateral diagnostic mammogram in August 2019 did not reveal any evidence of malignancy.  Review of her family history reveals a paternal aunt had breast cancer at age 72, her mother had pancreatic cancer at age 24 and her father was diagnosed with colon cancer at age 63. She completed 5 years of endocrine therapy with raloxifene  in January 2022.  Annual screening bilateral mammogram in October 2023 revealed a possible mass in the left breast. She then had a diagnostic mammogram along with an ultrasound. The revealed an oval circumscribed low density mass measuring 5-6 mm and U/S confirmed an anechoic benign cyst.  Bilateral screening mammogram in 1 year was recommended.  INTERVAL HISTORY:  Amanda Haas is here for annual follow up.  Patient states that she  feels good and has no complaints of pain. She completed a bilateral screening mammogram on 11/29/2023 which revealed no mammographic evidence of malignancy. She also completed a bone density scan on the same day which revealed a T-score of -0.7 for the lumbar spine, -2.0 for the left femoral neck, -1.1 for the left total hip, -1.9 for the right femoral neck -1.5 for the right total hip, consistent with osteopenia. She completed a colonoscopy on 08/17/2022 with Dr. Charlanne which was clear and he recommended a repeat in 10 years. I will graduate her to the Long-Term Survivorship program. She has routine labs at her PCP. I will see her back in 1 year with physical exam and screening mammogram. I have now reviewed her bone density scan from 2016 and the T-score of her spine was -0.3 and of the left femur, was -1.8, so there has been a slight worsening in the last 9 years.  She denies fever, chills, night sweats, or other signs of infection. She denies cardiorespiratory and gastrointestinal issues. She  denies pain. Her appetite is good and Her weight has decreased 3 pounds over last 1 year.   REVIEW OF SYSTEMS:  Review of Systems  Constitutional:  Negative for appetite change, chills, fatigue, fever and unexpected weight change.  HENT:   Negative for lump/mass, mouth sores and sore throat.   Respiratory:  Negative for cough and shortness of breath.   Cardiovascular:  Negative for chest pain and leg swelling.  Gastrointestinal:  Negative for abdominal pain, constipation, diarrhea, nausea and vomiting.  Endocrine: Negative for hot flashes.  Genitourinary:  Negative for difficulty urinating, dysuria, frequency  and hematuria.   Musculoskeletal:  Positive for arthralgias. Negative for back pain and myalgias.  Skin:  Negative for rash.  Neurological:  Negative for dizziness and headaches.  Hematological:  Negative for adenopathy. Does not bruise/bleed easily.  Psychiatric/Behavioral:  Negative for depression  and sleep disturbance. The patient is not nervous/anxious.     VITALS:  Blood pressure (!) 147/89, pulse 84, temperature (!) 97.5 F (36.4 C), temperature source Oral, resp. rate 16, height 5' 3 (1.6 m), weight 184 lb 1.6 oz (83.5 kg), SpO2 97%.  Wt Readings from Last 3 Encounters:  12/05/23 184 lb 1.6 oz (83.5 kg)  11/30/22 187 lb 3.2 oz (84.9 kg)  08/17/22 180 lb (81.6 kg)    Body mass index is 32.61 kg/m.  Performance status (ECOG): 0 - Asymptomatic  PHYSICAL EXAM:  Physical Exam Vitals and nursing note reviewed.  Constitutional:      General: She is not in acute distress.    Appearance: Normal appearance.  HENT:     Head: Normocephalic and atraumatic.     Mouth/Throat:     Mouth: Mucous membranes are moist.     Pharynx: Oropharynx is clear. No oropharyngeal exudate or posterior oropharyngeal erythema.  Eyes:     General: No scleral icterus.    Extraocular Movements: Extraocular movements intact.     Conjunctiva/sclera: Conjunctivae normal.     Pupils: Pupils are equal, round, and reactive to light.  Cardiovascular:     Rate and Rhythm: Normal rate and regular rhythm.     Heart sounds: Normal heart sounds. No murmur heard.    No friction rub. No gallop.  Pulmonary:     Effort: Pulmonary effort is normal.     Breath sounds: Normal breath sounds. No wheezing, rhonchi or rales.  Chest:  Breasts:    Right: Normal.     Left: Normal.     Comments: Deep scar in the inferior right breat which is well-healed Mild fibrocystic changes bilaterally Abdominal:     General: There is no distension.     Palpations: Abdomen is soft. There is no hepatomegaly, splenomegaly or mass.     Tenderness: There is no abdominal tenderness.  Musculoskeletal:        General: Normal range of motion.     Cervical back: Normal range of motion and neck supple. No tenderness.     Right lower leg: No edema.     Left lower leg: No edema.  Lymphadenopathy:     Cervical: No cervical adenopathy.      Upper Body:     Right upper body: No supraclavicular or axillary adenopathy.     Left upper body: No supraclavicular or axillary adenopathy.     Lower Body: No right inguinal adenopathy. No left inguinal adenopathy.  Skin:    General: Skin is warm and dry.     Coloration: Skin is not jaundiced.     Findings: No rash.  Neurological:     Mental Status: She is alert and oriented to person, place, and time.     Cranial Nerves: No cranial nerve deficit.  Psychiatric:        Mood and Affect: Mood normal.        Behavior: Behavior normal.        Thought Content: Thought content normal.     LABS:      Latest Ref Rng & Units 06/05/2019   12:28 PM  CBC  WBC 4.0 - 10.5 K/uL 5.7   Hemoglobin 12.0 -  15.0 g/dL 84.9   Hematocrit 63.9 - 46.0 % 47.4   Platelets 150 - 400 K/uL 272       Latest Ref Rng & Units 06/05/2019   12:28 PM  CMP  Glucose 70 - 99 mg/dL 899   BUN 8 - 23 mg/dL 23   Creatinine 9.55 - 1.00 mg/dL 9.34   Sodium 864 - 854 mmol/L 139   Potassium 3.5 - 5.1 mmol/L 3.6   Chloride 98 - 111 mmol/L 100   CO2 22 - 32 mmol/L 27   Calcium  8.9 - 10.3 mg/dL 9.6     STUDIES:  MM 3D SCREENING MAMMOGRAM BILATERAL BREAST Result Date: 12/03/2023 CLINICAL DATA:  Screening. EXAM: DIGITAL SCREENING BILATERAL MAMMOGRAM WITH TOMOSYNTHESIS AND CAD TECHNIQUE: Bilateral screening digital craniocaudal and mediolateral oblique mammograms were obtained. Bilateral screening digital breast tomosynthesis was performed. The images were evaluated with computer-aided detection. COMPARISON:  Previous exam(s). ACR Breast Density Category b: There are scattered areas of fibroglandular density. FINDINGS: There are no findings suspicious for malignancy. IMPRESSION: No mammographic evidence of malignancy. A result letter of this screening mammogram will be mailed directly to the patient. RECOMMENDATION: Screening mammogram in one year. (Code:SM-B-01Y) BI-RADS CATEGORY  1: Negative. Electronically Signed   By:  Norleen Croak M.D.   On: 12/03/2023 08:35   DG BONE DENSITY (DXA) Result Date: 11/29/2023 EXAM: DUAL X-RAY ABSORPTIOMETRY (DXA) FOR BONE MINERAL DENSITY 11/29/2023 10:24 am CLINICAL DATA:  68 year old Female Postmenopausal. Low bone density TECHNIQUE: An axial (e.g., hips, spine) and/or appendicular (e.g., radius) exam was performed, as appropriate, using [employer] Secretary/administrator at [employer]. Images are obtained for bone mineral density measurement and are not obtained for diagnostic purposes. [id number] Exclusions: L1, L3. COMPARISON:  None. New baseline. FINDINGS: Scan quality: Good. LUMBAR SPINE (L2, L4): BMD (in g/cm2): 1.120 T-score: -0.7 Z-score: 1.0 LEFT FEMORAL NECK: BMD (in g/cm2): 0.766 T-score: -2.0 Z-score: -0.4 LEFT TOTAL HIP: BMD (in g/cm2): 0.864 T-score: -1.1 Z-score: 0.2 RIGHT FEMORAL NECK: BMD (in g/cm2): 0.771 T-score: -1.9 Z-score: -0.4 RIGHT TOTAL HIP: BMD (in g/cm2): 0.814 T-score: -1.5 Z-score: -0.2 FRAX 10-YEAR PROBABILITY OF FRACTURE: 10-year fracture risk is performed using the [employer] FRAX calculator based on patient-reported risk factors. Major osteoporotic fracture: 10.5% Hip fracture: 1.6% Other situations known to alter the reliability of the FRAX score should be considered when making treatment decisions, including chronic glucocorticoid use and past treatments. Further guidance on treatment can be found at the [facility] Foundation's website https://www.patton.com/. IMPRESSION: Osteopenia based on BMD. Fracture risk is increased. Increased risk is based on low BMD. RECOMMENDATIONS: 1. All patients should optimize calcium  and vitamin D intake. 2. Consider FDA-approved medical therapies in postmenopausal women and men aged 60 years and older, based on the following: - A hip or vertebral (clinical or morphometric) fracture - T-score less than or equal to -2.5 and secondary causes have been excluded. - Low bone mass (T-score between -1.0 and -2.5)  and a 10-year probability of a hip fracture greater than or equal to 3% or a 10-year probability of a major osteoporosis-related fracture greater than or equal to 20% based on the [place] -adapted WHO algorithm. - Clinician judgment and/or patient preferences may indicate treatment for people with 10-year fracture probabilities above or below these levels 3. Patients with diagnosis of osteoporosis or at high risk for fracture should have regular bone mineral density tests. For patients eligible for Medicare, routine testing is allowed once every 2 years. The testing  frequency can be increased to one year for patients who have rapidly progressing disease, those who are receiving or discontinuing medical therapy to restore bone mass, or have additional risk factors. Electronically Signed   By: Dina  Arceo M.D.   On: 11/29/2023 13:16    EXAM: 01/05/15 DG DEXA FINDINGS: Femur Neck Left T-score: -1.8  HISTORY:   Allergies:  Allergies  Allergen Reactions   Nsaids Other (See Comments)    Lodine only can take all other NSAIDS  Non-steroidal anti-inflammatory agent (product)  Non-steroidal anti-inflammatory agent (substance)   Etodolac Rash and Other (See Comments)    Other reaction(s): Not available, Other (See Comments)  NDC Rniz:[id number]  unknown    Current Medications: Current Outpatient Medications  Medication Sig Dispense Refill   cyanocobalamin  (VITAMIN B12) 1000 MCG/ML injection Inject 1,000 mcg into the skin.     hydrochlorothiazide  (MICROZIDE ) 12.5 MG capsule Take 12.5 mg by mouth daily.     losartan (COZAAR) 25 MG tablet Take 25 mg by mouth daily.     metFORMIN  (GLUCOPHAGE -XR) 500 MG 24 hr tablet TAKE ONE TABLET BY MOUTH ONCE DAILY WITH BREAKFAST     albuterol  (PROVENTIL  HFA;VENTOLIN  HFA) 108 (90 BASE) MCG/ACT inhaler Inhale 1-2 puffs into the lungs every 6 (six) hours as needed for wheezing or shortness of breath.      aspirin  81 MG chewable tablet Chew 81 mg by mouth daily.      celecoxib  (CELEBREX ) 100 MG capsule Take 1 capsule (100 mg total) by mouth daily. 90 capsule 3   Cholecalciferol (VITAMIN D3) 125 MCG (5000 UT) CAPS Take 5,000 Units by mouth daily.     clorazepate  (TRANXENE ) 7.5 MG tablet Take 0.5 tablets by mouth as directed.     levothyroxine  (SYNTHROID ) 100 MCG tablet Take 1 tablet (100 mcg total) by mouth daily. 90 tablet 3   pantoprazole  (PROTONIX ) 40 MG tablet Take 1 tablet (40 mg total) by mouth daily. 90 tablet 3   promethazine  (PHENERGAN ) 25 MG tablet Take 1 tablet (25 mg total) by mouth every 6 (six) hours as needed for nausea. 90 tablet 2   No current facility-administered medications for this visit.     ASSESSMENT & PLAN:   Assessment: Kadisha Goodine is a 68 y.o. female with ductal carcinoma in situ of the breast diagnosed in August 2016.  She completed 5 years of chemoprevention with raloxifene  in January 2022. She remains without evidence of recurrence    Stage 0 breast cancer now over 9 years postop with no evidence of disease. 2.    Osteopenia, relatively stable for 9 years.  Plan:  She completed a bilateral screening mammogram on 11/29/2023 which revealed no mammographic evidence of malignancy. She also completed a bone density scan on the same day which revealed a T-score of -0.7 for the lumbar spine, -2.0 for the left femoral neck, -1.1 for the left total hip, -1.9 for the right femoral neck -1.5 for the right total hip, consistent with osteopenia. She completed a colonoscopy on 08/17/2022 with Dr. Charlanne which was clear and he recommended a repeat in 10 years. I will graduate her to the Long-Term Survivorship program. She has routine labs at her PCP. I will see her back in one year with physical exam and screening mammogram. I have now reviewed her bone density scan from 2016 and the T-score of her spine was -0.3 and of the left femur, was -1.8, so there has only been a slight worsening in the last 9 years. The  patient understands  the plans discussed today and is in agreement with them.  She knows to contact our office if she develops concerns regarding her breast cancer or its treatment.   I provided 12 minutes of face-to-face time during this this encounter and > 50% was spent counseling as documented under my assessment and plan.    Wanda VEAR Cornish, MD [facility] AT [facility] [address] [place] [address] Dept: [phone] Dept Fax: [phone]     I,Miguel Medal H Jemma Rasp,acting as a scribe for Wanda VEAR Cornish, MD.,have documented all relevant documentation on the behalf of Wanda VEAR Cornish, MD,as directed by  Wanda VEAR Cornish, MD while in the presence of Wanda VEAR Cornish, MD.  I have reviewed this report as typed by the medical scribe, and it is complete and accurate

## 2023-12-05 NOTE — Telephone Encounter (Signed)
 Patient has been scheduled for follow-up visit per 12/05/23 LOS.  Pt given an appt calendar with date and time.

## 2023-12-22 DIAGNOSIS — M8580 Other specified disorders of bone density and structure, unspecified site: Secondary | ICD-10-CM | POA: Insufficient documentation

## 2024-01-08 ENCOUNTER — Encounter: Payer: Self-pay | Admitting: Oncology

## 2024-11-30 ENCOUNTER — Ambulatory Visit (HOSPITAL_BASED_OUTPATIENT_CLINIC_OR_DEPARTMENT_OTHER): Admitting: Radiology

## 2024-12-04 ENCOUNTER — Inpatient Hospital Stay: Admitting: Hematology and Oncology
